# Patient Record
Sex: Female | Born: 1945
Health system: Southern US, Community
[De-identification: ages and names within clinical notes are randomized; demographics above are authoritative.]

## PROBLEM LIST (undated history)

## (undated) DIAGNOSIS — H409 Unspecified glaucoma: Secondary | ICD-10-CM

## (undated) DIAGNOSIS — F419 Anxiety disorder, unspecified: Secondary | ICD-10-CM

## (undated) DIAGNOSIS — I10 Essential (primary) hypertension: Secondary | ICD-10-CM

## (undated) HISTORY — PX: CATARACT EXTRACTION: SUR2

## (undated) HISTORY — DX: Anxiety disorder, unspecified: F41.9

## (undated) HISTORY — PX: KNEE SURGERY: SHX244

## (undated) HISTORY — PX: MOUTH SURGERY: SHX715

## (undated) HISTORY — DX: Essential (primary) hypertension: I10

## (undated) HISTORY — PX: OTHER SURGICAL HISTORY: SHX169

## (undated) HISTORY — DX: Unspecified glaucoma: H40.9

## (undated) HISTORY — PX: FOOT SURGERY: SHX648

---

## 1997-09-27 ENCOUNTER — Ambulatory Visit (HOSPITAL_BASED_OUTPATIENT_CLINIC_OR_DEPARTMENT_OTHER): Admission: RE | Admit: 1997-09-27 | Discharge: 1997-09-27 | Payer: Self-pay | Admitting: Orthopedic Surgery

## 1997-11-09 ENCOUNTER — Ambulatory Visit (HOSPITAL_COMMUNITY): Admission: RE | Admit: 1997-11-09 | Discharge: 1997-11-09 | Payer: Self-pay | Admitting: Obstetrics and Gynecology

## 1998-11-10 ENCOUNTER — Ambulatory Visit (HOSPITAL_COMMUNITY): Admission: RE | Admit: 1998-11-10 | Discharge: 1998-11-10 | Payer: Self-pay | Admitting: Obstetrics and Gynecology

## 1998-11-10 ENCOUNTER — Encounter: Payer: Self-pay | Admitting: Obstetrics and Gynecology

## 1999-05-02 ENCOUNTER — Other Ambulatory Visit: Admission: RE | Admit: 1999-05-02 | Discharge: 1999-05-02 | Payer: Self-pay | Admitting: Obstetrics and Gynecology

## 1999-10-14 ENCOUNTER — Emergency Department (HOSPITAL_COMMUNITY): Admission: EM | Admit: 1999-10-14 | Discharge: 1999-10-14 | Payer: Self-pay | Admitting: *Deleted

## 1999-11-15 ENCOUNTER — Ambulatory Visit (HOSPITAL_COMMUNITY): Admission: RE | Admit: 1999-11-15 | Discharge: 1999-11-15 | Payer: Self-pay | Admitting: Internal Medicine

## 1999-11-15 ENCOUNTER — Encounter: Payer: Self-pay | Admitting: Internal Medicine

## 2000-05-23 ENCOUNTER — Other Ambulatory Visit: Admission: RE | Admit: 2000-05-23 | Discharge: 2000-05-23 | Payer: Self-pay | Admitting: Obstetrics and Gynecology

## 2000-05-28 ENCOUNTER — Encounter: Payer: Self-pay | Admitting: Orthopedic Surgery

## 2000-05-30 ENCOUNTER — Observation Stay (HOSPITAL_COMMUNITY): Admission: RE | Admit: 2000-05-30 | Discharge: 2000-06-01 | Payer: Self-pay | Admitting: Orthopedic Surgery

## 2000-07-30 ENCOUNTER — Ambulatory Visit (HOSPITAL_COMMUNITY)
Admission: RE | Admit: 2000-07-30 | Discharge: 2000-07-30 | Payer: Self-pay | Admitting: Physical Medicine and Rehabilitation

## 2000-11-15 ENCOUNTER — Ambulatory Visit (HOSPITAL_COMMUNITY): Admission: RE | Admit: 2000-11-15 | Discharge: 2000-11-15 | Payer: Self-pay | Admitting: Obstetrics and Gynecology

## 2000-11-15 ENCOUNTER — Encounter: Payer: Self-pay | Admitting: Obstetrics and Gynecology

## 2001-03-19 ENCOUNTER — Encounter: Admission: RE | Admit: 2001-03-19 | Discharge: 2001-03-19 | Payer: Self-pay | Admitting: Obstetrics and Gynecology

## 2001-03-19 ENCOUNTER — Encounter: Payer: Self-pay | Admitting: Obstetrics and Gynecology

## 2001-05-20 ENCOUNTER — Encounter: Admission: RE | Admit: 2001-05-20 | Discharge: 2001-05-20 | Payer: Self-pay | Admitting: Otolaryngology

## 2001-05-20 ENCOUNTER — Encounter: Payer: Self-pay | Admitting: Otolaryngology

## 2001-05-27 ENCOUNTER — Other Ambulatory Visit: Admission: RE | Admit: 2001-05-27 | Discharge: 2001-05-27 | Payer: Self-pay | Admitting: Obstetrics and Gynecology

## 2001-11-18 ENCOUNTER — Ambulatory Visit (HOSPITAL_COMMUNITY): Admission: RE | Admit: 2001-11-18 | Discharge: 2001-11-18 | Payer: Self-pay | Admitting: Obstetrics and Gynecology

## 2001-11-18 ENCOUNTER — Encounter: Payer: Self-pay | Admitting: Obstetrics and Gynecology

## 2001-11-23 ENCOUNTER — Emergency Department (HOSPITAL_COMMUNITY): Admission: EM | Admit: 2001-11-23 | Discharge: 2001-11-24 | Payer: Self-pay | Admitting: Emergency Medicine

## 2002-04-21 ENCOUNTER — Encounter: Payer: Self-pay | Admitting: Family Medicine

## 2002-04-21 ENCOUNTER — Encounter: Admission: RE | Admit: 2002-04-21 | Discharge: 2002-04-21 | Payer: Self-pay | Admitting: Family Medicine

## 2002-12-01 ENCOUNTER — Ambulatory Visit (HOSPITAL_COMMUNITY): Admission: RE | Admit: 2002-12-01 | Discharge: 2002-12-01 | Payer: Self-pay | Admitting: Obstetrics and Gynecology

## 2002-12-01 ENCOUNTER — Encounter: Payer: Self-pay | Admitting: Obstetrics and Gynecology

## 2011-06-20 DIAGNOSIS — M25559 Pain in unspecified hip: Secondary | ICD-10-CM | POA: Diagnosis not present

## 2011-06-20 DIAGNOSIS — Z96659 Presence of unspecified artificial knee joint: Secondary | ICD-10-CM | POA: Diagnosis not present

## 2011-06-22 DIAGNOSIS — H4011X0 Primary open-angle glaucoma, stage unspecified: Secondary | ICD-10-CM | POA: Diagnosis not present

## 2011-08-24 DIAGNOSIS — I10 Essential (primary) hypertension: Secondary | ICD-10-CM | POA: Diagnosis not present

## 2011-08-24 DIAGNOSIS — Z1231 Encounter for screening mammogram for malignant neoplasm of breast: Secondary | ICD-10-CM | POA: Diagnosis not present

## 2011-08-24 DIAGNOSIS — Z1211 Encounter for screening for malignant neoplasm of colon: Secondary | ICD-10-CM | POA: Diagnosis not present

## 2011-08-24 DIAGNOSIS — D649 Anemia, unspecified: Secondary | ICD-10-CM | POA: Diagnosis not present

## 2011-08-24 DIAGNOSIS — R911 Solitary pulmonary nodule: Secondary | ICD-10-CM | POA: Diagnosis not present

## 2011-08-24 DIAGNOSIS — E782 Mixed hyperlipidemia: Secondary | ICD-10-CM | POA: Diagnosis not present

## 2011-08-27 DIAGNOSIS — H4011X0 Primary open-angle glaucoma, stage unspecified: Secondary | ICD-10-CM | POA: Diagnosis not present

## 2011-09-07 DIAGNOSIS — I10 Essential (primary) hypertension: Secondary | ICD-10-CM | POA: Diagnosis not present

## 2011-09-11 DIAGNOSIS — R918 Other nonspecific abnormal finding of lung field: Secondary | ICD-10-CM | POA: Diagnosis not present

## 2011-09-11 DIAGNOSIS — J9819 Other pulmonary collapse: Secondary | ICD-10-CM | POA: Diagnosis not present

## 2011-09-13 DIAGNOSIS — M25579 Pain in unspecified ankle and joints of unspecified foot: Secondary | ICD-10-CM | POA: Diagnosis not present

## 2011-10-22 DIAGNOSIS — S61209A Unspecified open wound of unspecified finger without damage to nail, initial encounter: Secondary | ICD-10-CM | POA: Diagnosis not present

## 2011-10-22 DIAGNOSIS — Z23 Encounter for immunization: Secondary | ICD-10-CM | POA: Diagnosis not present

## 2012-01-01 DIAGNOSIS — H40019 Open angle with borderline findings, low risk, unspecified eye: Secondary | ICD-10-CM | POA: Diagnosis not present

## 2012-01-01 DIAGNOSIS — H524 Presbyopia: Secondary | ICD-10-CM | POA: Diagnosis not present

## 2012-01-02 DIAGNOSIS — Z23 Encounter for immunization: Secondary | ICD-10-CM | POA: Diagnosis not present

## 2012-01-09 DIAGNOSIS — I2510 Atherosclerotic heart disease of native coronary artery without angina pectoris: Secondary | ICD-10-CM | POA: Diagnosis not present

## 2012-01-09 DIAGNOSIS — I10 Essential (primary) hypertension: Secondary | ICD-10-CM | POA: Diagnosis not present

## 2012-01-15 DIAGNOSIS — J209 Acute bronchitis, unspecified: Secondary | ICD-10-CM | POA: Diagnosis not present

## 2012-01-17 DIAGNOSIS — Z1231 Encounter for screening mammogram for malignant neoplasm of breast: Secondary | ICD-10-CM | POA: Diagnosis not present

## 2012-02-21 DIAGNOSIS — M949 Disorder of cartilage, unspecified: Secondary | ICD-10-CM | POA: Diagnosis not present

## 2012-02-21 DIAGNOSIS — M899 Disorder of bone, unspecified: Secondary | ICD-10-CM | POA: Diagnosis not present

## 2012-03-10 DIAGNOSIS — R5383 Other fatigue: Secondary | ICD-10-CM | POA: Diagnosis not present

## 2012-03-10 DIAGNOSIS — I10 Essential (primary) hypertension: Secondary | ICD-10-CM | POA: Diagnosis not present

## 2012-03-10 DIAGNOSIS — R5381 Other malaise: Secondary | ICD-10-CM | POA: Diagnosis not present

## 2012-03-10 DIAGNOSIS — E782 Mixed hyperlipidemia: Secondary | ICD-10-CM | POA: Diagnosis not present

## 2012-03-19 DIAGNOSIS — L723 Sebaceous cyst: Secondary | ICD-10-CM | POA: Diagnosis not present

## 2012-03-19 DIAGNOSIS — L570 Actinic keratosis: Secondary | ICD-10-CM | POA: Diagnosis not present

## 2012-03-19 DIAGNOSIS — L820 Inflamed seborrheic keratosis: Secondary | ICD-10-CM | POA: Diagnosis not present

## 2012-03-19 DIAGNOSIS — B079 Viral wart, unspecified: Secondary | ICD-10-CM | POA: Diagnosis not present

## 2012-03-19 DIAGNOSIS — D1801 Hemangioma of skin and subcutaneous tissue: Secondary | ICD-10-CM | POA: Diagnosis not present

## 2012-03-19 DIAGNOSIS — D485 Neoplasm of uncertain behavior of skin: Secondary | ICD-10-CM | POA: Diagnosis not present

## 2012-03-24 DIAGNOSIS — Z1212 Encounter for screening for malignant neoplasm of rectum: Secondary | ICD-10-CM | POA: Diagnosis not present

## 2012-03-24 DIAGNOSIS — Z124 Encounter for screening for malignant neoplasm of cervix: Secondary | ICD-10-CM | POA: Diagnosis not present

## 2012-04-03 DIAGNOSIS — H9209 Otalgia, unspecified ear: Secondary | ICD-10-CM | POA: Diagnosis not present

## 2012-04-07 DIAGNOSIS — M999 Biomechanical lesion, unspecified: Secondary | ICD-10-CM | POA: Diagnosis not present

## 2012-04-07 DIAGNOSIS — IMO0002 Reserved for concepts with insufficient information to code with codable children: Secondary | ICD-10-CM | POA: Diagnosis not present

## 2012-04-07 DIAGNOSIS — M5137 Other intervertebral disc degeneration, lumbosacral region: Secondary | ICD-10-CM | POA: Diagnosis not present

## 2012-04-10 DIAGNOSIS — M999 Biomechanical lesion, unspecified: Secondary | ICD-10-CM | POA: Diagnosis not present

## 2012-04-10 DIAGNOSIS — IMO0002 Reserved for concepts with insufficient information to code with codable children: Secondary | ICD-10-CM | POA: Diagnosis not present

## 2012-04-10 DIAGNOSIS — M5137 Other intervertebral disc degeneration, lumbosacral region: Secondary | ICD-10-CM | POA: Diagnosis not present

## 2012-04-14 DIAGNOSIS — M999 Biomechanical lesion, unspecified: Secondary | ICD-10-CM | POA: Diagnosis not present

## 2012-04-14 DIAGNOSIS — M5137 Other intervertebral disc degeneration, lumbosacral region: Secondary | ICD-10-CM | POA: Diagnosis not present

## 2012-04-14 DIAGNOSIS — IMO0002 Reserved for concepts with insufficient information to code with codable children: Secondary | ICD-10-CM | POA: Diagnosis not present

## 2012-04-17 DIAGNOSIS — M5137 Other intervertebral disc degeneration, lumbosacral region: Secondary | ICD-10-CM | POA: Diagnosis not present

## 2012-04-17 DIAGNOSIS — M999 Biomechanical lesion, unspecified: Secondary | ICD-10-CM | POA: Diagnosis not present

## 2012-04-17 DIAGNOSIS — IMO0002 Reserved for concepts with insufficient information to code with codable children: Secondary | ICD-10-CM | POA: Diagnosis not present

## 2012-04-21 DIAGNOSIS — M5137 Other intervertebral disc degeneration, lumbosacral region: Secondary | ICD-10-CM | POA: Diagnosis not present

## 2012-04-21 DIAGNOSIS — IMO0002 Reserved for concepts with insufficient information to code with codable children: Secondary | ICD-10-CM | POA: Diagnosis not present

## 2012-04-21 DIAGNOSIS — M999 Biomechanical lesion, unspecified: Secondary | ICD-10-CM | POA: Diagnosis not present

## 2012-04-24 DIAGNOSIS — IMO0002 Reserved for concepts with insufficient information to code with codable children: Secondary | ICD-10-CM | POA: Diagnosis not present

## 2012-04-24 DIAGNOSIS — M999 Biomechanical lesion, unspecified: Secondary | ICD-10-CM | POA: Diagnosis not present

## 2012-04-24 DIAGNOSIS — M5137 Other intervertebral disc degeneration, lumbosacral region: Secondary | ICD-10-CM | POA: Diagnosis not present

## 2012-04-28 DIAGNOSIS — M999 Biomechanical lesion, unspecified: Secondary | ICD-10-CM | POA: Diagnosis not present

## 2012-04-28 DIAGNOSIS — M5137 Other intervertebral disc degeneration, lumbosacral region: Secondary | ICD-10-CM | POA: Diagnosis not present

## 2012-04-28 DIAGNOSIS — IMO0002 Reserved for concepts with insufficient information to code with codable children: Secondary | ICD-10-CM | POA: Diagnosis not present

## 2012-05-01 DIAGNOSIS — M999 Biomechanical lesion, unspecified: Secondary | ICD-10-CM | POA: Diagnosis not present

## 2012-05-01 DIAGNOSIS — IMO0002 Reserved for concepts with insufficient information to code with codable children: Secondary | ICD-10-CM | POA: Diagnosis not present

## 2012-05-01 DIAGNOSIS — M5137 Other intervertebral disc degeneration, lumbosacral region: Secondary | ICD-10-CM | POA: Diagnosis not present

## 2012-05-05 DIAGNOSIS — M999 Biomechanical lesion, unspecified: Secondary | ICD-10-CM | POA: Diagnosis not present

## 2012-05-05 DIAGNOSIS — IMO0002 Reserved for concepts with insufficient information to code with codable children: Secondary | ICD-10-CM | POA: Diagnosis not present

## 2012-05-05 DIAGNOSIS — M5137 Other intervertebral disc degeneration, lumbosacral region: Secondary | ICD-10-CM | POA: Diagnosis not present

## 2012-05-06 DIAGNOSIS — H4011X0 Primary open-angle glaucoma, stage unspecified: Secondary | ICD-10-CM | POA: Diagnosis not present

## 2012-05-09 DIAGNOSIS — M5137 Other intervertebral disc degeneration, lumbosacral region: Secondary | ICD-10-CM | POA: Diagnosis not present

## 2012-05-09 DIAGNOSIS — IMO0002 Reserved for concepts with insufficient information to code with codable children: Secondary | ICD-10-CM | POA: Diagnosis not present

## 2012-05-09 DIAGNOSIS — M999 Biomechanical lesion, unspecified: Secondary | ICD-10-CM | POA: Diagnosis not present

## 2012-05-15 DIAGNOSIS — M999 Biomechanical lesion, unspecified: Secondary | ICD-10-CM | POA: Diagnosis not present

## 2012-05-15 DIAGNOSIS — IMO0002 Reserved for concepts with insufficient information to code with codable children: Secondary | ICD-10-CM | POA: Diagnosis not present

## 2012-05-15 DIAGNOSIS — M5137 Other intervertebral disc degeneration, lumbosacral region: Secondary | ICD-10-CM | POA: Diagnosis not present

## 2012-05-19 DIAGNOSIS — M25819 Other specified joint disorders, unspecified shoulder: Secondary | ICD-10-CM | POA: Diagnosis not present

## 2012-05-29 DIAGNOSIS — M25819 Other specified joint disorders, unspecified shoulder: Secondary | ICD-10-CM | POA: Diagnosis not present

## 2012-06-04 DIAGNOSIS — M25819 Other specified joint disorders, unspecified shoulder: Secondary | ICD-10-CM | POA: Diagnosis not present

## 2012-06-12 DIAGNOSIS — M25819 Other specified joint disorders, unspecified shoulder: Secondary | ICD-10-CM | POA: Diagnosis not present

## 2012-06-18 DIAGNOSIS — M25819 Other specified joint disorders, unspecified shoulder: Secondary | ICD-10-CM | POA: Diagnosis not present

## 2012-06-19 DIAGNOSIS — M25819 Other specified joint disorders, unspecified shoulder: Secondary | ICD-10-CM | POA: Diagnosis not present

## 2012-06-26 DIAGNOSIS — L570 Actinic keratosis: Secondary | ICD-10-CM | POA: Diagnosis not present

## 2012-06-26 DIAGNOSIS — M25819 Other specified joint disorders, unspecified shoulder: Secondary | ICD-10-CM | POA: Diagnosis not present

## 2012-06-26 DIAGNOSIS — B079 Viral wart, unspecified: Secondary | ICD-10-CM | POA: Diagnosis not present

## 2012-06-26 DIAGNOSIS — L723 Sebaceous cyst: Secondary | ICD-10-CM | POA: Diagnosis not present

## 2012-06-26 DIAGNOSIS — L820 Inflamed seborrheic keratosis: Secondary | ICD-10-CM | POA: Diagnosis not present

## 2012-07-03 DIAGNOSIS — M25819 Other specified joint disorders, unspecified shoulder: Secondary | ICD-10-CM | POA: Diagnosis not present

## 2012-07-07 DIAGNOSIS — M25819 Other specified joint disorders, unspecified shoulder: Secondary | ICD-10-CM | POA: Diagnosis not present

## 2012-07-14 DIAGNOSIS — M25819 Other specified joint disorders, unspecified shoulder: Secondary | ICD-10-CM | POA: Diagnosis not present

## 2012-07-17 DIAGNOSIS — L570 Actinic keratosis: Secondary | ICD-10-CM | POA: Diagnosis not present

## 2012-07-17 DIAGNOSIS — M25819 Other specified joint disorders, unspecified shoulder: Secondary | ICD-10-CM | POA: Diagnosis not present

## 2012-08-05 DIAGNOSIS — L821 Other seborrheic keratosis: Secondary | ICD-10-CM | POA: Diagnosis not present

## 2012-08-05 DIAGNOSIS — L819 Disorder of pigmentation, unspecified: Secondary | ICD-10-CM | POA: Diagnosis not present

## 2012-08-05 DIAGNOSIS — L570 Actinic keratosis: Secondary | ICD-10-CM | POA: Diagnosis not present

## 2012-08-18 DIAGNOSIS — M25819 Other specified joint disorders, unspecified shoulder: Secondary | ICD-10-CM | POA: Diagnosis not present

## 2012-09-22 DIAGNOSIS — R918 Other nonspecific abnormal finding of lung field: Secondary | ICD-10-CM | POA: Diagnosis not present

## 2012-09-22 DIAGNOSIS — Z1231 Encounter for screening mammogram for malignant neoplasm of breast: Secondary | ICD-10-CM | POA: Diagnosis not present

## 2012-09-22 DIAGNOSIS — Z1211 Encounter for screening for malignant neoplasm of colon: Secondary | ICD-10-CM | POA: Diagnosis not present

## 2012-09-22 DIAGNOSIS — I10 Essential (primary) hypertension: Secondary | ICD-10-CM | POA: Diagnosis not present

## 2012-09-22 DIAGNOSIS — E782 Mixed hyperlipidemia: Secondary | ICD-10-CM | POA: Diagnosis not present

## 2012-09-25 DIAGNOSIS — M25579 Pain in unspecified ankle and joints of unspecified foot: Secondary | ICD-10-CM | POA: Diagnosis not present

## 2012-10-01 DIAGNOSIS — M19079 Primary osteoarthritis, unspecified ankle and foot: Secondary | ICD-10-CM | POA: Diagnosis not present

## 2012-10-08 DIAGNOSIS — I10 Essential (primary) hypertension: Secondary | ICD-10-CM | POA: Diagnosis not present

## 2012-11-05 DIAGNOSIS — M159 Polyosteoarthritis, unspecified: Secondary | ICD-10-CM | POA: Diagnosis not present

## 2012-11-25 DIAGNOSIS — H4011X0 Primary open-angle glaucoma, stage unspecified: Secondary | ICD-10-CM | POA: Diagnosis not present

## 2012-11-25 DIAGNOSIS — H409 Unspecified glaucoma: Secondary | ICD-10-CM | POA: Diagnosis not present

## 2012-12-09 DIAGNOSIS — J329 Chronic sinusitis, unspecified: Secondary | ICD-10-CM | POA: Diagnosis not present

## 2012-12-17 DIAGNOSIS — M25579 Pain in unspecified ankle and joints of unspecified foot: Secondary | ICD-10-CM | POA: Diagnosis not present

## 2012-12-17 DIAGNOSIS — Q666 Other congenital valgus deformities of feet: Secondary | ICD-10-CM | POA: Diagnosis not present

## 2012-12-19 DIAGNOSIS — L821 Other seborrheic keratosis: Secondary | ICD-10-CM | POA: Diagnosis not present

## 2012-12-19 DIAGNOSIS — D485 Neoplasm of uncertain behavior of skin: Secondary | ICD-10-CM | POA: Diagnosis not present

## 2012-12-19 DIAGNOSIS — L909 Atrophic disorder of skin, unspecified: Secondary | ICD-10-CM | POA: Diagnosis not present

## 2013-01-05 DIAGNOSIS — M25819 Other specified joint disorders, unspecified shoulder: Secondary | ICD-10-CM | POA: Diagnosis not present

## 2013-01-06 DIAGNOSIS — IMO0002 Reserved for concepts with insufficient information to code with codable children: Secondary | ICD-10-CM | POA: Diagnosis not present

## 2013-01-06 DIAGNOSIS — M5137 Other intervertebral disc degeneration, lumbosacral region: Secondary | ICD-10-CM | POA: Diagnosis not present

## 2013-01-06 DIAGNOSIS — M999 Biomechanical lesion, unspecified: Secondary | ICD-10-CM | POA: Diagnosis not present

## 2013-01-07 DIAGNOSIS — Z23 Encounter for immunization: Secondary | ICD-10-CM | POA: Diagnosis not present

## 2013-01-08 DIAGNOSIS — M25819 Other specified joint disorders, unspecified shoulder: Secondary | ICD-10-CM | POA: Diagnosis not present

## 2013-01-12 DIAGNOSIS — I10 Essential (primary) hypertension: Secondary | ICD-10-CM | POA: Diagnosis not present

## 2013-01-13 DIAGNOSIS — M25819 Other specified joint disorders, unspecified shoulder: Secondary | ICD-10-CM | POA: Diagnosis not present

## 2013-01-15 DIAGNOSIS — M25819 Other specified joint disorders, unspecified shoulder: Secondary | ICD-10-CM | POA: Diagnosis not present

## 2013-01-19 DIAGNOSIS — Z1231 Encounter for screening mammogram for malignant neoplasm of breast: Secondary | ICD-10-CM | POA: Diagnosis not present

## 2013-01-21 DIAGNOSIS — M79609 Pain in unspecified limb: Secondary | ICD-10-CM | POA: Diagnosis not present

## 2013-01-27 DIAGNOSIS — M25819 Other specified joint disorders, unspecified shoulder: Secondary | ICD-10-CM | POA: Diagnosis not present

## 2013-01-27 DIAGNOSIS — S46819A Strain of other muscles, fascia and tendons at shoulder and upper arm level, unspecified arm, initial encounter: Secondary | ICD-10-CM | POA: Diagnosis not present

## 2013-02-13 DIAGNOSIS — M25819 Other specified joint disorders, unspecified shoulder: Secondary | ICD-10-CM | POA: Diagnosis not present

## 2013-03-19 DIAGNOSIS — J40 Bronchitis, not specified as acute or chronic: Secondary | ICD-10-CM | POA: Diagnosis not present

## 2013-03-19 DIAGNOSIS — J209 Acute bronchitis, unspecified: Secondary | ICD-10-CM | POA: Diagnosis not present

## 2013-03-23 DIAGNOSIS — M25819 Other specified joint disorders, unspecified shoulder: Secondary | ICD-10-CM | POA: Diagnosis not present

## 2013-04-21 DIAGNOSIS — R059 Cough, unspecified: Secondary | ICD-10-CM | POA: Diagnosis not present

## 2013-04-21 DIAGNOSIS — R05 Cough: Secondary | ICD-10-CM | POA: Diagnosis not present

## 2013-04-21 DIAGNOSIS — D649 Anemia, unspecified: Secondary | ICD-10-CM | POA: Diagnosis not present

## 2013-04-21 DIAGNOSIS — I10 Essential (primary) hypertension: Secondary | ICD-10-CM | POA: Diagnosis not present

## 2013-05-20 DIAGNOSIS — M25819 Other specified joint disorders, unspecified shoulder: Secondary | ICD-10-CM | POA: Diagnosis not present

## 2013-05-21 DIAGNOSIS — J309 Allergic rhinitis, unspecified: Secondary | ICD-10-CM | POA: Diagnosis not present

## 2013-05-21 DIAGNOSIS — K219 Gastro-esophageal reflux disease without esophagitis: Secondary | ICD-10-CM | POA: Diagnosis not present

## 2013-05-21 DIAGNOSIS — R05 Cough: Secondary | ICD-10-CM | POA: Diagnosis not present

## 2013-05-21 DIAGNOSIS — R059 Cough, unspecified: Secondary | ICD-10-CM | POA: Diagnosis not present

## 2013-05-26 DIAGNOSIS — H4011X0 Primary open-angle glaucoma, stage unspecified: Secondary | ICD-10-CM | POA: Diagnosis not present

## 2013-05-26 DIAGNOSIS — H409 Unspecified glaucoma: Secondary | ICD-10-CM | POA: Diagnosis not present

## 2013-07-20 DIAGNOSIS — J310 Chronic rhinitis: Secondary | ICD-10-CM | POA: Diagnosis not present

## 2013-07-20 DIAGNOSIS — R1314 Dysphagia, pharyngoesophageal phase: Secondary | ICD-10-CM | POA: Diagnosis not present

## 2013-07-20 DIAGNOSIS — K2100 Gastro-esophageal reflux disease with esophagitis, without bleeding: Secondary | ICD-10-CM | POA: Diagnosis not present

## 2013-08-10 DIAGNOSIS — K2100 Gastro-esophageal reflux disease with esophagitis, without bleeding: Secondary | ICD-10-CM | POA: Diagnosis not present

## 2013-08-10 DIAGNOSIS — R1314 Dysphagia, pharyngoesophageal phase: Secondary | ICD-10-CM | POA: Diagnosis not present

## 2013-08-10 DIAGNOSIS — J301 Allergic rhinitis due to pollen: Secondary | ICD-10-CM | POA: Diagnosis not present

## 2013-10-07 DIAGNOSIS — J309 Allergic rhinitis, unspecified: Secondary | ICD-10-CM | POA: Diagnosis not present

## 2013-10-07 DIAGNOSIS — Z23 Encounter for immunization: Secondary | ICD-10-CM | POA: Diagnosis not present

## 2013-10-07 DIAGNOSIS — I10 Essential (primary) hypertension: Secondary | ICD-10-CM | POA: Diagnosis not present

## 2013-10-07 DIAGNOSIS — R259 Unspecified abnormal involuntary movements: Secondary | ICD-10-CM | POA: Diagnosis not present

## 2013-10-07 DIAGNOSIS — F430 Acute stress reaction: Secondary | ICD-10-CM | POA: Diagnosis not present

## 2013-10-20 DIAGNOSIS — I10 Essential (primary) hypertension: Secondary | ICD-10-CM | POA: Diagnosis not present

## 2013-10-20 DIAGNOSIS — Z0000 Encounter for general adult medical examination without abnormal findings: Secondary | ICD-10-CM | POA: Diagnosis not present

## 2013-10-20 DIAGNOSIS — Z13820 Encounter for screening for osteoporosis: Secondary | ICD-10-CM | POA: Diagnosis not present

## 2013-10-20 DIAGNOSIS — R259 Unspecified abnormal involuntary movements: Secondary | ICD-10-CM | POA: Diagnosis not present

## 2013-10-20 DIAGNOSIS — Z136 Encounter for screening for cardiovascular disorders: Secondary | ICD-10-CM | POA: Diagnosis not present

## 2013-10-20 DIAGNOSIS — J309 Allergic rhinitis, unspecified: Secondary | ICD-10-CM | POA: Diagnosis not present

## 2013-11-10 DIAGNOSIS — H409 Unspecified glaucoma: Secondary | ICD-10-CM | POA: Diagnosis not present

## 2013-11-10 DIAGNOSIS — H4011X0 Primary open-angle glaucoma, stage unspecified: Secondary | ICD-10-CM | POA: Diagnosis not present

## 2013-11-10 DIAGNOSIS — H2510 Age-related nuclear cataract, unspecified eye: Secondary | ICD-10-CM | POA: Diagnosis not present

## 2013-11-11 DIAGNOSIS — L570 Actinic keratosis: Secondary | ICD-10-CM | POA: Diagnosis not present

## 2013-11-11 DIAGNOSIS — D235 Other benign neoplasm of skin of trunk: Secondary | ICD-10-CM | POA: Diagnosis not present

## 2013-11-11 DIAGNOSIS — L820 Inflamed seborrheic keratosis: Secondary | ICD-10-CM | POA: Diagnosis not present

## 2013-12-11 DIAGNOSIS — J329 Chronic sinusitis, unspecified: Secondary | ICD-10-CM | POA: Diagnosis not present

## 2013-12-21 DIAGNOSIS — J040 Acute laryngitis: Secondary | ICD-10-CM | POA: Diagnosis not present

## 2013-12-21 DIAGNOSIS — R059 Cough, unspecified: Secondary | ICD-10-CM | POA: Diagnosis not present

## 2013-12-21 DIAGNOSIS — K219 Gastro-esophageal reflux disease without esophagitis: Secondary | ICD-10-CM | POA: Diagnosis not present

## 2013-12-21 DIAGNOSIS — E669 Obesity, unspecified: Secondary | ICD-10-CM | POA: Diagnosis not present

## 2013-12-21 DIAGNOSIS — R05 Cough: Secondary | ICD-10-CM | POA: Diagnosis not present

## 2013-12-21 DIAGNOSIS — J329 Chronic sinusitis, unspecified: Secondary | ICD-10-CM | POA: Diagnosis not present

## 2013-12-29 DIAGNOSIS — R490 Dysphonia: Secondary | ICD-10-CM | POA: Diagnosis not present

## 2013-12-29 DIAGNOSIS — R0982 Postnasal drip: Secondary | ICD-10-CM | POA: Diagnosis not present

## 2013-12-29 DIAGNOSIS — K219 Gastro-esophageal reflux disease without esophagitis: Secondary | ICD-10-CM | POA: Diagnosis not present

## 2013-12-29 DIAGNOSIS — Z23 Encounter for immunization: Secondary | ICD-10-CM | POA: Diagnosis not present

## 2013-12-29 DIAGNOSIS — R259 Unspecified abnormal involuntary movements: Secondary | ICD-10-CM | POA: Diagnosis not present

## 2013-12-30 DIAGNOSIS — R259 Unspecified abnormal involuntary movements: Secondary | ICD-10-CM | POA: Diagnosis not present

## 2014-01-08 ENCOUNTER — Encounter: Payer: Self-pay | Admitting: Neurology

## 2014-01-08 ENCOUNTER — Encounter (INDEPENDENT_AMBULATORY_CARE_PROVIDER_SITE_OTHER): Payer: Self-pay

## 2014-01-08 ENCOUNTER — Ambulatory Visit (INDEPENDENT_AMBULATORY_CARE_PROVIDER_SITE_OTHER): Payer: Medicare Other | Admitting: Neurology

## 2014-01-08 ENCOUNTER — Ambulatory Visit: Payer: Medicare Other | Admitting: Neurology

## 2014-01-08 VITALS — BP 119/77 | HR 65 | Temp 97.0°F | Ht 59.0 in | Wt 184.0 lb

## 2014-01-08 DIAGNOSIS — G250 Essential tremor: Secondary | ICD-10-CM

## 2014-01-08 DIAGNOSIS — F411 Generalized anxiety disorder: Secondary | ICD-10-CM | POA: Diagnosis not present

## 2014-01-08 MED ORDER — PRIMIDONE 50 MG PO TABS: ORAL_TABLET | ORAL | Status: DC

## 2014-01-08 NOTE — Progress Notes (Signed)
Subjective:    Patient ID: Tyrianna Lightle is a 68 y.o. female.  HPI    Star Age, MD, PhD [facility] [address], [address] [place], [place] [address]  Dear Dr. Harrington Challenger,   I saw your patient, Maneh Sieben, upon your kind request in my neurologic clinic today for initial consultation of her tremor. The patient is unaccompanied today. As you know, Ms. Brun is a very pleasant 68 year old right-handed woman with an underlying medical history of hypertension, glaucoma, anxiety, and osteoarthritis, status post knee replacement surgeries, who has had a long-standing history of tremors of her hands, also affecting her voice at this time. She had seen a neurologist several years ago. She's currently on propranolol long-acting 60 mg strength one tablet twice daily. In the past she has also tried Mysoline.  She reports a family history of tremors in her father and her brother. She started having tremors in 2006. In 2009, she saw Dr. Kathlen Brunswick, who suggested mysoline. The patient tried it for 1 dose, but had nausea, vomiting and balance problems and stop the medication and did not go back for followup. Aggravating factors include anxiety, stress, sleep deprivation. She says she is very type A. She has noted that occasional alcohol use does reduce the tremor severity. She likes to drink caffeinated sodas, about 3 per day, mostly in the morning .  The tremor has been progressive. It started in her hands and then affected her head and voice. She is no longer able to write cursive. She was hoping, that in the last several years some new medications may have emerged that might help. She is tearful today when talking about her traumatizing childhood. She lives alone and has a lot of drug allergies. She has looked at the DBS option online and does not wish to explore surgery for tremor.   Her Past Medical History Is Significant For: Past Medical History  Diagnosis  Date  . Hypertension   . Anxiety   . Glaucoma     Her Past Surgical History Is Significant For: Past Surgical History  Procedure Laterality Date  . Knee surgery    . Hysterotomy    . Foot surgery      multiple    Her Family History Is Significant For: Family History  Problem Relation Age of Onset  . COPD Father   . COPD Brother     Her Social History Is Significant For: History   Social History  . Marital Status: Divorced    Spouse Name: N/A    Number of Children: 3  . Years of Education: coll.   Occupational History  .      retired   Social History Main Topics  . Smoking status: Never Smoker   . Smokeless tobacco: Never Used  . Alcohol Use: Yes     Comment: occas.  . Drug Use: No  . Sexual Activity: None   Other Topics Concern  . None   Social History Narrative   Patient consumes 2-3 cups of coke daily, is right handed,resides alone.    Her Allergies Are:  Allergies  Allergen Reactions  . Codeine   . Erythromycin   . Hydrocodone-Acetaminophen   . Iodinated Diagnostic Agents   . Meperidine   . Montelukast Sodium   . Morphine Sulfate   :   Her Current Medications Are:  Outpatient Encounter Prescriptions as of 01/08/2014  Medication Sig  . latanoprost (XALATAN) 0.005 % ophthalmic solution 1 drop at  bedtime. 1 drop both eyes daily  . propranolol (INDERAL) 60 MG tablet Take 60 mg by mouth 2 (two) times daily.  Marland Kitchen ALPRAZolam (XANAX) 0.5 MG tablet 2 (two) times daily.  Marland Kitchen amoxicillin-clavulanate (AUGMENTIN) 875-125 MG per tablet As needed , when going to dentist  . ipratropium (ATROVENT) 0.06 % nasal spray 2 drops in each nostril as needed  . losartan-hydrochlorothiazide (HYZAAR) 100-25 MG per tablet 1/2 tablet per day  :   Review of Systems:  Out of a complete 14 point review of systems, all are reviewed and negative with the exception of these symptoms as listed below:   Review of Systems  Neurological: Positive for tremors.       Insomnia     Objective:  Neurologic Exam  Physical Exam Physical Examination:   Filed Vitals:   01/08/14 1103  BP: 119/77  Pulse: 65  Temp: 97 F (36.1 C)    General Examination: The patient is a very pleasant 68 y.o. female in no acute distress. She appears well-developed and well-nourished and well groomed. She appears anxious, nervous, almost a little agitated at times and becomes tearful when she talks about her childhood.  HEENT: Normocephalic, atraumatic, pupils are equal, round and reactive to light and accommodation. Funduscopic exam is normal with sharp disc margins noted. Extraocular tracking is good without limitation to gaze excursion or nystagmus noted. Normal smooth pursuit is noted. Hearing is grossly intact. Face is symmetric with normal facial animation and normal facial sensation. Speech is clear with no dysarthria noted. There is no hypophonia. There is mild head (no-no type) and a moderate voice tremor. Neck is supple with full range of passive and active motion. There are no carotid bruits on auscultation. Oropharynx exam reveals: moderate mouth dryness, adequate dental hygiene and mild airway crowding, due to thicker tongue and thicker soft palate. Mallampati is class I. Tongue protrudes centrally and palate elevates symmetrically.   Chest: Clear to auscultation without wheezing, rhonchi or crackles noted.  Heart: S1+S2+0, regular and normal without murmurs, rubs or gallops noted.   Abdomen: Soft, non-tender and non-distended with normal bowel sounds appreciated on auscultation.  Extremities: There is pitting edema in the L ankle, s/p fracture some 3-4 years ago. Pedal pulses are intact.  Skin: Warm and dry without trophic changes noted. There are no varicose veins.  Musculoskeletal: exam reveals no obvious joint deformities, tenderness or joint swelling or erythema.   Neurologically:  Mental status: The patient is awake, alert and oriented in all 4 spheres. Her  immediate and remote memory, attention, language skills and fund of knowledge are appropriate. There is no evidence of aphasia, agnosia, apraxia or anomia. Speech is clear with normal prosody and enunciation. Thought process is linear. Mood is decreased range and affect is labile.  Cranial nerves II - XII are as described above under HEENT exam. In addition: shoulder shrug is normal with equal shoulder height noted. Motor exam: Normal bulk, strength and tone is noted. There is no drift, rebound. There is no resting tremor. There is a bilateral upper extremity postural and action tremor, which is mild to moderate in degree. There tremor frequency is fairly fast and the amplitude is medium. On Archimedes spiral drawing there is moderate to severe tremulousness noted, left more than right. Handwriting is moderately tremulous, but still legible and fairly large print. There is no evidence of micrographia.  Romberg is negative. Reflexes are 2+ in the UEs and 1+ in the LEs. Babinski: Toes are flexor bilaterally. Fine  motor skills and coordination:  overall mild impairment in the upper body but not impairment in the lower body.   Cerebellar testing: No dysmetria or intention tremor on finger to nose testing. Heel to shin is unremarkable bilaterally, with the exception of limitation in knee mobility bilaterally left worse than right. There is no truncal or gait ataxia.  Sensory exam: intact to light touch, pinprick, vibration, temperature sense in the upper and lower extremities.  Gait, station and balance: She stands with difficulty. No veering to one side is noted. No leaning to one side is noted. Posture is age-appropriate and stance is narrow based. Gait shows normal stride length and normal pace, but she has a limp. No problems turning are noted. She turns in 3 steps. Tandem walk is not possible for her.   Assessment and Plan:   In summary, Jaleiyah Alas is a very pleasant 68 y.o.-year old female with an  underlying medical history of hypertension, glaucoma, anxiety, and osteoarthritis, status post knee replacement surgeries, who has had a long-standing history of tremors of her hands, also affecting her voice at this time. She has a family history of tremors and on examination has overall moderate findings, left a little worse than right. I had a long chat with the patient today about my findings and the diagnosis of ET (essential tremor), the prognosis and treatment options.  she understands that there is limited symptomatic treatment available. Unfortunately she had side effects with Mysoline but may have taken too high a dose and may not have tried it long enough. She is not open to exploring the surgical option with DBS. I offered her a referral for consultation with a neurologist who is trained in DBS but she vehemently declined. She is already on a beta blocker. Her blood pressure is on the lower end and I would not recommend increasing her propranolol. Topamax may cause cognitive side effects and sedation and am reluctant to suggest that. I did ask her to try Mysoline again in lower dose and slower titration. She's agreeable to this. We talked about potential side effects. She is apprehensive but willing to give it another try. Unfortunately there is not a whole lot I can offer this lady. At this juncture we will try Mysoline 50 mg strength quarter tablet each night for a week with weekly increases by a quarter tablet to up to 50 mg each night in about 4 weeks. We talked about  typical tremor triggers. I asked her to return in about 2 weeks for recheck with with our nurse practitioner, Ms. Lam, and I will see the patient back after that. I am not sure if she has had her thyroid function checked recently. I would recommend a thyroid function test, if not checked in the last 6 months.  I provided her with a new prescription for Mysoline and written instructions. She had multiple questions which I  answered.   Thank you very much for allowing me to participate in the care of this nice patient. If I can be of any further assistance to you please do not hesitate to call me at [phone].  Sincerely,   Star Age, MD, PhD

## 2014-01-08 NOTE — Patient Instructions (Addendum)
  Please remember, that any kind of tremor may be exacerbated by anxiety, anger, nervousness, excitement, dehydration, sleep deprivation, by caffeine, and low blood sugar values or blood sugar fluctuations. Some medications, especially some antidepressants and lithium can cause or exacerbate tremors. Tremors may temporarily calm down her subside with the use of a benzodiazepine such as Valium or related medications and with alcohol. Be aware however that drinking alcohol is not an approved treatment or appropriate treatment for tremor control and long-term use of benzodiazepines such as Valium, lorazepam, alprazolam, or clonazepam can cause habit formation, physical and psychological addiction.  We will try mysoline in very low dose with slow titration. 1/4 pill each bedtime x 1 week, then 1/2 pill nightly x 1 weeks, then 3/4 pills nightly x 1 week, then 1 pill nightly thereafter. Common side effects reported are: Sleepiness, drowsiness, balance problems, confusion, and GI related symptoms.  Follow up in 2 months with nurse practioner, Ms. Chauncy Passy. I will see you back afterwards.

## 2014-01-27 ENCOUNTER — Other Ambulatory Visit: Payer: Self-pay

## 2014-01-27 DIAGNOSIS — Z1231 Encounter for screening mammogram for malignant neoplasm of breast: Secondary | ICD-10-CM

## 2014-02-10 ENCOUNTER — Ambulatory Visit
Admission: RE | Admit: 2014-02-10 | Discharge: 2014-02-10 | Disposition: A | Discharge status: Home / Self Care | Payer: Medicare Other | Source: Ambulatory Visit

## 2014-02-10 DIAGNOSIS — Z1231 Encounter for screening mammogram for malignant neoplasm of breast: Secondary | ICD-10-CM | POA: Diagnosis not present

## 2014-02-16 DIAGNOSIS — M8580 Other specified disorders of bone density and structure, unspecified site: Secondary | ICD-10-CM | POA: Diagnosis not present

## 2014-02-17 ENCOUNTER — Telehealth: Payer: Self-pay | Admitting: Neurology

## 2014-02-17 NOTE — Telephone Encounter (Signed)
Left message for patient regarding rescheduling 03/10/14 appointment per Jeani Hawking leaving, rescheduled to Dr. Guadelupe Sabin next available on 03/15/14.

## 2014-03-10 ENCOUNTER — Ambulatory Visit: Payer: Medicare Other | Admitting: Nurse Practitioner

## 2014-03-12 DIAGNOSIS — H4011X1 Primary open-angle glaucoma, mild stage: Secondary | ICD-10-CM | POA: Diagnosis not present

## 2014-03-15 ENCOUNTER — Encounter: Payer: Self-pay | Admitting: Neurology

## 2014-03-15 ENCOUNTER — Ambulatory Visit (INDEPENDENT_AMBULATORY_CARE_PROVIDER_SITE_OTHER): Payer: Medicare Other | Admitting: Neurology

## 2014-03-15 VITALS — BP 139/69 | HR 69 | Temp 97.0°F | Ht 59.0 in | Wt 183.0 lb

## 2014-03-15 DIAGNOSIS — F329 Major depressive disorder, single episode, unspecified: Secondary | ICD-10-CM

## 2014-03-15 DIAGNOSIS — G250 Essential tremor: Secondary | ICD-10-CM

## 2014-03-15 DIAGNOSIS — F32A Depression, unspecified: Secondary | ICD-10-CM

## 2014-03-15 MED ORDER — PRIMIDONE 50 MG PO TABS
ORAL_TABLET | ORAL | Status: DC
Start: 2014-03-15 — End: 2019-06-12

## 2014-03-15 NOTE — Progress Notes (Signed)
Subjective:    Patient ID: Annette Mathews is a 68 y.o. female.  HPI     Interim history:  [name] is a very pleasant 68 year old right-handed woman with an underlying medical history of hypertension, glaucoma, anxiety, and osteoarthritis, status post knee replacement surgeries, who presents for follow-up consultation of her essential tremor. The patient is unaccompanied today. I first met her on 01/08/2014 at the request of her primary care physician, at which time she reported a long-standing history of tremors of her hands, also affecting her voice. Even though she had nausea and vomiting with Mysoline in the past I suggested we try it again and the lower dose with a slower titration. She did not want to pursue surgical consultation for DBS.  Today, she reports that she developed diarrhea, when she went to 1/2 pill from 1/4 pill. She reports hyperactivity and stress. She had tried an antidepressant in the distant past, but did not do well on it, and does not recall its name. She does not think the Mysoline is helpful at half a pill daily. She tried to stop it abruptly but feels she had some problems coming off of it.  She had seen a neurologist several years ago. She's currently on propranolol long-acting 60 mg strength one tablet twice daily. In the past she has also tried Mysoline.   She reports a family history of tremors in her father and her brother. She started having tremors in 2006. In 2009, she saw Dr. Kathlen Brunswick, who suggested mysoline. The patient tried it for 1 dose, but had nausea, vomiting and balance problems and stop the medication and did not go back for followup. Aggravating factors include anxiety, stress, sleep deprivation. She says she is very type A. She has noted that occasional alcohol use does reduce the tremor severity. She likes to drink caffeinated sodas, about 3 per day, mostly in the morning .   The tremor has been progressive. It started in her hands and then  affected her head and voice. She is no longer able to write cursive. She was hoping, that in the last several years some new medications may have emerged that might help. She was tearful when talking about her traumatizing childhood. She lives alone and has a lot of drug allergies. She has looked at the DBS option online and does not wish to explore surgery for tremor.   Her Past Medical History Is Significant For: Past Medical History  Diagnosis Date  . Hypertension   . Anxiety   . Glaucoma     Her Past Surgical History Is Significant For: Past Surgical History  Procedure Laterality Date  . Knee surgery    . Hysterotomy    . Foot surgery      multiple    Her Family History Is Significant For: Family History  Problem Relation Age of Onset  . COPD Father   . COPD Brother     Her Social History Is Significant For: History   Social History  . Marital Status: Divorced    Spouse Name: N/A    Number of Children: 3  . Years of Education: coll.   Occupational History  .      retired   Social History Main Topics  . Smoking status: Never Smoker   . Smokeless tobacco: Never Used  . Alcohol Use: Yes     Comment: occas.  . Drug Use: No  . Sexual Activity: None   Other Topics Concern  . None  Social History Narrative   Patient consumes 2-3 cups of coke daily, is right handed,resides alone.    Her Allergies Are:  Allergies  Allergen Reactions  . Codeine   . Erythromycin   . Hydrocodone-Acetaminophen   . Iodinated Diagnostic Agents   . Meperidine   . Montelukast Sodium   . Morphine Sulfate   :   Her Current Medications Are:  Outpatient Encounter Prescriptions as of 03/15/2014  Medication Sig  . ALPRAZolam (XANAX) 0.5 MG tablet 2 (two) times daily.  . AMOXICILLIN PO Take 2,000 mg by mouth. Every 6 months when going to dentist  . FLUVIRIN SUSP   . ipratropium (ATROVENT) 0.06 % nasal spray 2 drops in each nostril as needed  . latanoprost (XALATAN) 0.005 %  ophthalmic solution 1 drop at bedtime. 1 drop both eyes daily  . losartan-hydrochlorothiazide (HYZAAR) 100-25 MG per tablet 1/2 tablet per day  . primidone (MYSOLINE) 50 MG tablet Take 1/4 pill each night for 1 week, then 1/2 pill each night for 1 week, then 3/4 pills each night for 1 week, then 1 pill each night. (Patient taking differently: 50 mg. Taking 1/2 tablet once daily)  . propranolol (INDERAL) 60 MG tablet Take 60 mg by mouth 2 (two) times daily.  . [DISCONTINUED] amoxicillin-clavulanate (AUGMENTIN) 875-125 MG per tablet As needed , when going to dentist  :  Review of Systems:  Out of a complete 14 point review of systems, all are reviewed and negative with the exception of these symptoms as listed below:   Review of Systems  Respiratory: Positive for cough.   Neurological: Positive for tremors.    Objective:  Neurologic Exam  Physical Exam Physical Examination:   Filed Vitals:   03/15/14 1111  BP: 139/69  Pulse: 69  Temp: 97 F (36.1 C)    General Examination: The patient is a very pleasant 68 y.o. female in no acute distress. She appears well-developed and well-nourished and well groomed. She appears anxious and is tearful at times.   HEENT: Normocephalic, atraumatic, pupils are equal, round and reactive to light and accommodation. Funduscopic exam is normal with sharp disc margins noted. She has mild bilateral cataracts. Extraocular tracking is good without limitation to gaze excursion or nystagmus noted. Normal smooth pursuit is noted. Hearing is grossly intact. Face is symmetric with normal facial animation and normal facial sensation. Speech is clear with no dysarthria noted. There is no hypophonia. There is mild head (no-no type) and a moderate voice tremor. Neck is supple with full range of passive and active motion. There are no carotid bruits on auscultation. Oropharynx exam reveals: moderate mouth dryness, adequate dental hygiene and mild airway crowding, due to  thicker tongue and thicker soft palate. Mallampati is class I. Tongue protrudes centrally and palate elevates symmetrically.   Chest: Clear to auscultation without wheezing, rhonchi or crackles noted.  Heart: S1+S2+0, regular and normal without murmurs, rubs or gallops noted.   Abdomen: Soft, non-tender and non-distended with normal bowel sounds appreciated on auscultation.  Extremities: There is 1+ pitting edema in the L ankle, s/p fracture some 3-4 years ago. Pedal pulses are intact.  Skin: Warm and dry without trophic changes noted. There are no varicose veins.  Musculoskeletal: exam reveals no obvious joint deformities, tenderness or joint swelling or erythema.   Neurologically:  Mental status: The patient is awake, alert and oriented in all 4 spheres. Her immediate and remote memory, attention, language skills and fund of knowledge are appropriate. There is no evidence  of aphasia, agnosia, apraxia or anomia. Speech is clear with normal prosody and enunciation. Thought process is linear. Mood is decreased range and affect is labile.  Cranial nerves II - XII are as described above under HEENT exam. In addition: shoulder shrug is normal with equal shoulder height noted. Motor exam: Normal bulk, strength and tone is noted. There is no drift, or rebound. There is no resting tremor. There is a bilateral upper extremity postural and action tremor, which is very mild today. There tremor frequency is fairly fast and the amplitude is medium.   Romberg is negative. Reflexes are 2+ in the UEs and 1+ in the LEs. Babinski: Toes are flexor bilaterally. Fine motor skills and coordination:  overall mild impairment in the upper body but not impairment in the lower body.   Cerebellar testing: No dysmetria or intention tremor on finger to nose testing. Heel to shin is unremarkable bilaterally, with the exception of limitation in knee mobility bilaterally left worse than right. There is no truncal or gait ataxia.   Sensory exam: intact to light touch in the upper and lower extremities.  Gait, station and balance: She stands with difficulty. No veering to one side is noted. No leaning to one side is noted. Posture is age-appropriate and stance is narrow based. Gait shows normal stride length and normal pace, but she has a limp. No problems turning are noted. She turns in 3 steps. Tandem walk is not possible for her.   Assessment and Plan:   In summary, Chester Romero is a very pleasant 68 year old female with an underlying medical history of hypertension, glaucoma, anxiety, and osteoarthritis, status post knee replacement surgeries, who has had a long-standing history of  essential tremor, affecting primarily both hands and particularly her handwriting. She was not able to tolerate Mysoline and at this time I suggested she taper off of it. She developed diarrhea on it. She says she has a sensitive digestive system. She is advised to go down to quarter pill daily for 1 week then quarter pill every other day for one week then stop. I will prescribe a 2 week supply for her. She needs a refill to be able to come off of it gradually. I provided her with that. We talked at length about essential tremor and treatment options. She is on a beta blocker at this time and is encouraged to continue with that. She is encouraged to follow-up with her primary care physician at this time and I will see her back as needed. She did not wish to pursue DBS evaluation for her tremors. For her depression and anxiety she is advised to follow-up with her primary care physician. In the past she tried an antidepressant and had side effects but there are some newer medications out that she may benefit from. She is encouraged to talk to Dr. Harrington Challenger about potentially trying another antidepressant. She is worried about the financial implications of trying a new medication. She does endorse stress. She has an appointment with Dr. Harrington Challenger next month. I gave  her written instructions and provided her with a prescription so she can taper off of Mysoline. I answered all her questions today and the patient was in agreement.

## 2014-03-15 NOTE — Patient Instructions (Signed)
  Please remember, that any kind of tremor may be exacerbated by anxiety, anger, nervousness, excitement, dehydration, sleep deprivation, by caffeine, and low blood sugar values or blood sugar fluctuations. Some medications, especially some antidepressants and lithium can cause or exacerbate tremors. Tremors may temporarily calm down her subside with the use of a benzodiazepine such as Valium or related medications and with alcohol. Be aware however that drinking alcohol is not an approved treatment or appropriate treatment for tremor control and long-term use of benzodiazepines such as Valium, lorazepam, alprazolam, or clonazepam can cause habit formation, physical and psychological addiction.  We will gradually take you off the primidone: take 1/4 pill each night for 1 week, then 1/4 pill every other night for one week, then stop.   I will see you back as needed. Please follow up with Dr. Harrington Challenger as scheduled. Please talk to him about potentially trying a newer kind of antidepressant.

## 2014-03-19 DIAGNOSIS — J301 Allergic rhinitis due to pollen: Secondary | ICD-10-CM | POA: Diagnosis not present

## 2014-03-19 DIAGNOSIS — H60502 Unspecified acute noninfective otitis externa, left ear: Secondary | ICD-10-CM | POA: Diagnosis not present

## 2014-03-19 DIAGNOSIS — K219 Gastro-esophageal reflux disease without esophagitis: Secondary | ICD-10-CM | POA: Diagnosis not present

## 2014-03-19 DIAGNOSIS — R0982 Postnasal drip: Secondary | ICD-10-CM | POA: Diagnosis not present

## 2014-04-06 DIAGNOSIS — L299 Pruritus, unspecified: Secondary | ICD-10-CM | POA: Diagnosis not present

## 2014-04-06 DIAGNOSIS — H60502 Unspecified acute noninfective otitis externa, left ear: Secondary | ICD-10-CM | POA: Diagnosis not present

## 2014-05-03 ENCOUNTER — Other Ambulatory Visit: Payer: Self-pay | Admitting: Family Medicine

## 2014-05-03 DIAGNOSIS — R110 Nausea: Secondary | ICD-10-CM

## 2014-05-03 DIAGNOSIS — R0981 Nasal congestion: Secondary | ICD-10-CM | POA: Diagnosis not present

## 2014-05-03 DIAGNOSIS — I10 Essential (primary) hypertension: Secondary | ICD-10-CM | POA: Diagnosis not present

## 2014-05-03 DIAGNOSIS — R350 Frequency of micturition: Secondary | ICD-10-CM | POA: Diagnosis not present

## 2014-05-03 DIAGNOSIS — M8580 Other specified disorders of bone density and structure, unspecified site: Secondary | ICD-10-CM | POA: Diagnosis not present

## 2014-05-03 DIAGNOSIS — R197 Diarrhea, unspecified: Secondary | ICD-10-CM | POA: Diagnosis not present

## 2014-05-10 ENCOUNTER — Ambulatory Visit
Admission: RE | Admit: 2014-05-10 | Discharge: 2014-05-10 | Disposition: A | Discharge status: Home / Self Care | Payer: Medicare Other | Source: Ambulatory Visit | Attending: Family Medicine | Admitting: Family Medicine

## 2014-05-10 DIAGNOSIS — R110 Nausea: Secondary | ICD-10-CM | POA: Diagnosis not present

## 2014-05-10 DIAGNOSIS — R1011 Right upper quadrant pain: Secondary | ICD-10-CM | POA: Diagnosis not present

## 2014-05-13 ENCOUNTER — Other Ambulatory Visit (HOSPITAL_COMMUNITY): Payer: Self-pay | Admitting: Family Medicine

## 2014-05-13 DIAGNOSIS — R1011 Right upper quadrant pain: Secondary | ICD-10-CM

## 2014-05-31 ENCOUNTER — Ambulatory Visit (HOSPITAL_COMMUNITY): Payer: Medicare Other

## 2014-05-31 DIAGNOSIS — J209 Acute bronchitis, unspecified: Secondary | ICD-10-CM | POA: Diagnosis not present

## 2014-05-31 DIAGNOSIS — J329 Chronic sinusitis, unspecified: Secondary | ICD-10-CM | POA: Diagnosis not present

## 2014-06-03 DIAGNOSIS — R05 Cough: Secondary | ICD-10-CM | POA: Diagnosis not present

## 2014-06-03 DIAGNOSIS — R899 Unspecified abnormal finding in specimens from other organs, systems and tissues: Secondary | ICD-10-CM | POA: Diagnosis not present

## 2014-06-10 ENCOUNTER — Ambulatory Visit: Payer: Medicare Other | Admitting: Neurology

## 2014-06-15 DIAGNOSIS — J4540 Moderate persistent asthma, uncomplicated: Secondary | ICD-10-CM | POA: Diagnosis not present

## 2014-06-15 DIAGNOSIS — J309 Allergic rhinitis, unspecified: Secondary | ICD-10-CM | POA: Diagnosis not present

## 2014-06-15 DIAGNOSIS — J370 Chronic laryngitis: Secondary | ICD-10-CM | POA: Diagnosis not present

## 2014-06-21 DIAGNOSIS — J019 Acute sinusitis, unspecified: Secondary | ICD-10-CM | POA: Diagnosis not present

## 2014-06-21 DIAGNOSIS — J301 Allergic rhinitis due to pollen: Secondary | ICD-10-CM | POA: Diagnosis not present

## 2014-09-20 DIAGNOSIS — H4011X2 Primary open-angle glaucoma, moderate stage: Secondary | ICD-10-CM | POA: Diagnosis not present

## 2014-10-20 DIAGNOSIS — T63443A Toxic effect of venom of bees, assault, initial encounter: Secondary | ICD-10-CM | POA: Diagnosis not present

## 2014-10-20 DIAGNOSIS — J3089 Other allergic rhinitis: Secondary | ICD-10-CM | POA: Diagnosis not present

## 2014-10-20 DIAGNOSIS — J301 Allergic rhinitis due to pollen: Secondary | ICD-10-CM | POA: Diagnosis not present

## 2014-10-22 DIAGNOSIS — Z136 Encounter for screening for cardiovascular disorders: Secondary | ICD-10-CM | POA: Diagnosis not present

## 2014-10-22 DIAGNOSIS — M810 Age-related osteoporosis without current pathological fracture: Secondary | ICD-10-CM | POA: Diagnosis not present

## 2014-10-22 DIAGNOSIS — Z0000 Encounter for general adult medical examination without abnormal findings: Secondary | ICD-10-CM | POA: Diagnosis not present

## 2014-10-22 DIAGNOSIS — I10 Essential (primary) hypertension: Secondary | ICD-10-CM | POA: Diagnosis not present

## 2014-10-22 DIAGNOSIS — H409 Unspecified glaucoma: Secondary | ICD-10-CM | POA: Diagnosis not present

## 2014-10-22 DIAGNOSIS — Z1211 Encounter for screening for malignant neoplasm of colon: Secondary | ICD-10-CM | POA: Diagnosis not present

## 2014-10-22 DIAGNOSIS — G250 Essential tremor: Secondary | ICD-10-CM | POA: Diagnosis not present

## 2014-11-25 DIAGNOSIS — M722 Plantar fascial fibromatosis: Secondary | ICD-10-CM | POA: Diagnosis not present

## 2014-12-03 DIAGNOSIS — Z23 Encounter for immunization: Secondary | ICD-10-CM | POA: Diagnosis not present

## 2014-12-08 DIAGNOSIS — X32XXXD Exposure to sunlight, subsequent encounter: Secondary | ICD-10-CM | POA: Diagnosis not present

## 2014-12-08 DIAGNOSIS — L570 Actinic keratosis: Secondary | ICD-10-CM | POA: Diagnosis not present

## 2014-12-08 DIAGNOSIS — Z1283 Encounter for screening for malignant neoplasm of skin: Secondary | ICD-10-CM | POA: Diagnosis not present

## 2014-12-08 DIAGNOSIS — L918 Other hypertrophic disorders of the skin: Secondary | ICD-10-CM | POA: Diagnosis not present

## 2014-12-20 ENCOUNTER — Other Ambulatory Visit: Payer: Self-pay

## 2014-12-20 DIAGNOSIS — Z1231 Encounter for screening mammogram for malignant neoplasm of breast: Secondary | ICD-10-CM

## 2014-12-22 DIAGNOSIS — L918 Other hypertrophic disorders of the skin: Secondary | ICD-10-CM | POA: Diagnosis not present

## 2014-12-29 DIAGNOSIS — G479 Sleep disorder, unspecified: Secondary | ICD-10-CM | POA: Diagnosis not present

## 2014-12-29 DIAGNOSIS — R5383 Other fatigue: Secondary | ICD-10-CM | POA: Diagnosis not present

## 2014-12-29 DIAGNOSIS — G250 Essential tremor: Secondary | ICD-10-CM | POA: Diagnosis not present

## 2014-12-29 DIAGNOSIS — I10 Essential (primary) hypertension: Secondary | ICD-10-CM | POA: Diagnosis not present

## 2014-12-29 DIAGNOSIS — F411 Generalized anxiety disorder: Secondary | ICD-10-CM | POA: Diagnosis not present

## 2015-01-24 DIAGNOSIS — K629 Disease of anus and rectum, unspecified: Secondary | ICD-10-CM | POA: Diagnosis not present

## 2015-01-24 DIAGNOSIS — Z1211 Encounter for screening for malignant neoplasm of colon: Secondary | ICD-10-CM | POA: Diagnosis not present

## 2015-01-31 DIAGNOSIS — M19172 Post-traumatic osteoarthritis, left ankle and foot: Secondary | ICD-10-CM | POA: Diagnosis not present

## 2015-01-31 DIAGNOSIS — M2140 Flat foot [pes planus] (acquired), unspecified foot: Secondary | ICD-10-CM | POA: Diagnosis not present

## 2015-02-11 DIAGNOSIS — R197 Diarrhea, unspecified: Secondary | ICD-10-CM | POA: Diagnosis not present

## 2015-02-11 DIAGNOSIS — Z1211 Encounter for screening for malignant neoplasm of colon: Secondary | ICD-10-CM | POA: Diagnosis not present

## 2015-02-14 ENCOUNTER — Ambulatory Visit
Admission: RE | Admit: 2015-02-14 | Discharge: 2015-02-14 | Disposition: A | Discharge status: Home / Self Care | Payer: Medicare Other | Source: Ambulatory Visit

## 2015-02-14 DIAGNOSIS — Z1231 Encounter for screening mammogram for malignant neoplasm of breast: Secondary | ICD-10-CM

## 2015-03-02 DIAGNOSIS — M1990 Unspecified osteoarthritis, unspecified site: Secondary | ICD-10-CM | POA: Diagnosis not present

## 2015-03-02 DIAGNOSIS — F411 Generalized anxiety disorder: Secondary | ICD-10-CM | POA: Diagnosis not present

## 2015-03-02 DIAGNOSIS — R251 Tremor, unspecified: Secondary | ICD-10-CM | POA: Diagnosis not present

## 2015-03-22 DIAGNOSIS — H401111 Primary open-angle glaucoma, right eye, mild stage: Secondary | ICD-10-CM | POA: Diagnosis not present

## 2015-03-22 DIAGNOSIS — H401122 Primary open-angle glaucoma, left eye, moderate stage: Secondary | ICD-10-CM | POA: Diagnosis not present

## 2015-04-01 DIAGNOSIS — D175 Benign lipomatous neoplasm of intra-abdominal organs: Secondary | ICD-10-CM | POA: Diagnosis not present

## 2015-04-01 DIAGNOSIS — D126 Benign neoplasm of colon, unspecified: Secondary | ICD-10-CM | POA: Diagnosis not present

## 2015-04-01 DIAGNOSIS — K640 First degree hemorrhoids: Secondary | ICD-10-CM | POA: Diagnosis not present

## 2015-04-01 DIAGNOSIS — Z1211 Encounter for screening for malignant neoplasm of colon: Secondary | ICD-10-CM | POA: Diagnosis not present

## 2015-04-01 DIAGNOSIS — K5730 Diverticulosis of large intestine without perforation or abscess without bleeding: Secondary | ICD-10-CM | POA: Diagnosis not present

## 2015-04-01 DIAGNOSIS — D120 Benign neoplasm of cecum: Secondary | ICD-10-CM | POA: Diagnosis not present

## 2015-05-03 DIAGNOSIS — H401132 Primary open-angle glaucoma, bilateral, moderate stage: Secondary | ICD-10-CM | POA: Diagnosis not present

## 2015-05-12 DIAGNOSIS — J0141 Acute recurrent pansinusitis: Secondary | ICD-10-CM | POA: Diagnosis not present

## 2015-05-18 DIAGNOSIS — L600 Ingrowing nail: Secondary | ICD-10-CM | POA: Diagnosis not present

## 2015-06-01 DIAGNOSIS — L600 Ingrowing nail: Secondary | ICD-10-CM | POA: Diagnosis not present

## 2015-07-01 DIAGNOSIS — H401122 Primary open-angle glaucoma, left eye, moderate stage: Secondary | ICD-10-CM | POA: Diagnosis not present

## 2015-07-01 DIAGNOSIS — H401112 Primary open-angle glaucoma, right eye, moderate stage: Secondary | ICD-10-CM | POA: Diagnosis not present

## 2015-07-13 DIAGNOSIS — M79676 Pain in unspecified toe(s): Secondary | ICD-10-CM | POA: Diagnosis not present

## 2015-07-20 DIAGNOSIS — J3089 Other allergic rhinitis: Secondary | ICD-10-CM | POA: Diagnosis not present

## 2015-07-20 DIAGNOSIS — T63443A Toxic effect of venom of bees, assault, initial encounter: Secondary | ICD-10-CM | POA: Diagnosis not present

## 2015-07-20 DIAGNOSIS — J301 Allergic rhinitis due to pollen: Secondary | ICD-10-CM | POA: Diagnosis not present

## 2015-09-09 DIAGNOSIS — R42 Dizziness and giddiness: Secondary | ICD-10-CM | POA: Diagnosis not present

## 2015-09-09 DIAGNOSIS — J309 Allergic rhinitis, unspecified: Secondary | ICD-10-CM | POA: Diagnosis not present

## 2015-09-27 DIAGNOSIS — H401132 Primary open-angle glaucoma, bilateral, moderate stage: Secondary | ICD-10-CM | POA: Diagnosis not present

## 2015-10-25 DIAGNOSIS — Z0000 Encounter for general adult medical examination without abnormal findings: Secondary | ICD-10-CM | POA: Diagnosis not present

## 2015-10-25 DIAGNOSIS — I10 Essential (primary) hypertension: Secondary | ICD-10-CM | POA: Diagnosis not present

## 2015-10-25 DIAGNOSIS — F411 Generalized anxiety disorder: Secondary | ICD-10-CM | POA: Diagnosis not present

## 2015-10-25 DIAGNOSIS — G250 Essential tremor: Secondary | ICD-10-CM | POA: Diagnosis not present

## 2015-10-25 DIAGNOSIS — E559 Vitamin D deficiency, unspecified: Secondary | ICD-10-CM | POA: Diagnosis not present

## 2015-10-25 DIAGNOSIS — M810 Age-related osteoporosis without current pathological fracture: Secondary | ICD-10-CM | POA: Diagnosis not present

## 2015-11-08 DIAGNOSIS — J0100 Acute maxillary sinusitis, unspecified: Secondary | ICD-10-CM | POA: Diagnosis not present

## 2015-12-26 DIAGNOSIS — Z23 Encounter for immunization: Secondary | ICD-10-CM | POA: Diagnosis not present

## 2015-12-26 DIAGNOSIS — L304 Erythema intertrigo: Secondary | ICD-10-CM | POA: Diagnosis not present

## 2015-12-26 DIAGNOSIS — Z1283 Encounter for screening for malignant neoplasm of skin: Secondary | ICD-10-CM | POA: Diagnosis not present

## 2015-12-30 DIAGNOSIS — I10 Essential (primary) hypertension: Secondary | ICD-10-CM | POA: Diagnosis not present

## 2015-12-30 DIAGNOSIS — K1379 Other lesions of oral mucosa: Secondary | ICD-10-CM | POA: Diagnosis not present

## 2016-02-03 ENCOUNTER — Other Ambulatory Visit: Payer: Self-pay | Admitting: Family Medicine

## 2016-02-03 DIAGNOSIS — Z1231 Encounter for screening mammogram for malignant neoplasm of breast: Secondary | ICD-10-CM

## 2016-02-07 DIAGNOSIS — J302 Other seasonal allergic rhinitis: Secondary | ICD-10-CM | POA: Diagnosis not present

## 2016-02-14 DIAGNOSIS — R05 Cough: Secondary | ICD-10-CM | POA: Diagnosis not present

## 2016-02-20 DIAGNOSIS — M6740 Ganglion, unspecified site: Secondary | ICD-10-CM | POA: Diagnosis not present

## 2016-02-20 DIAGNOSIS — J329 Chronic sinusitis, unspecified: Secondary | ICD-10-CM | POA: Diagnosis not present

## 2016-02-23 DIAGNOSIS — J329 Chronic sinusitis, unspecified: Secondary | ICD-10-CM | POA: Diagnosis not present

## 2016-02-27 ENCOUNTER — Ambulatory Visit
Admission: RE | Admit: 2016-02-27 | Discharge: 2016-02-27 | Disposition: A | Discharge status: Home / Self Care | Payer: Medicare Other | Source: Ambulatory Visit | Attending: Family Medicine | Admitting: Family Medicine

## 2016-02-27 ENCOUNTER — Ambulatory Visit: Payer: Medicare Other

## 2016-02-27 DIAGNOSIS — Z1231 Encounter for screening mammogram for malignant neoplasm of breast: Secondary | ICD-10-CM

## 2016-02-27 DIAGNOSIS — J320 Chronic maxillary sinusitis: Secondary | ICD-10-CM | POA: Diagnosis not present

## 2016-03-09 DIAGNOSIS — H401122 Primary open-angle glaucoma, left eye, moderate stage: Secondary | ICD-10-CM | POA: Diagnosis not present

## 2016-03-09 DIAGNOSIS — H2513 Age-related nuclear cataract, bilateral: Secondary | ICD-10-CM | POA: Diagnosis not present

## 2016-03-09 DIAGNOSIS — H401111 Primary open-angle glaucoma, right eye, mild stage: Secondary | ICD-10-CM | POA: Diagnosis not present

## 2016-03-26 DIAGNOSIS — M810 Age-related osteoporosis without current pathological fracture: Secondary | ICD-10-CM | POA: Diagnosis not present

## 2016-03-26 DIAGNOSIS — M8588 Other specified disorders of bone density and structure, other site: Secondary | ICD-10-CM | POA: Diagnosis not present

## 2016-04-20 DIAGNOSIS — M6740 Ganglion, unspecified site: Secondary | ICD-10-CM | POA: Diagnosis not present

## 2016-04-20 DIAGNOSIS — M19032 Primary osteoarthritis, left wrist: Secondary | ICD-10-CM | POA: Diagnosis not present

## 2016-04-20 DIAGNOSIS — M25532 Pain in left wrist: Secondary | ICD-10-CM | POA: Diagnosis not present

## 2016-07-18 DIAGNOSIS — T63443A Toxic effect of venom of bees, assault, initial encounter: Secondary | ICD-10-CM | POA: Diagnosis not present

## 2016-07-18 DIAGNOSIS — J3089 Other allergic rhinitis: Secondary | ICD-10-CM | POA: Diagnosis not present

## 2016-07-18 DIAGNOSIS — B999 Unspecified infectious disease: Secondary | ICD-10-CM | POA: Diagnosis not present

## 2016-07-18 DIAGNOSIS — J301 Allergic rhinitis due to pollen: Secondary | ICD-10-CM | POA: Diagnosis not present

## 2016-07-31 DIAGNOSIS — J301 Allergic rhinitis due to pollen: Secondary | ICD-10-CM | POA: Diagnosis not present

## 2016-07-31 DIAGNOSIS — J3081 Allergic rhinitis due to animal (cat) (dog) hair and dander: Secondary | ICD-10-CM | POA: Diagnosis not present

## 2016-07-31 DIAGNOSIS — J3089 Other allergic rhinitis: Secondary | ICD-10-CM | POA: Diagnosis not present

## 2016-07-31 DIAGNOSIS — T63443A Toxic effect of venom of bees, assault, initial encounter: Secondary | ICD-10-CM | POA: Diagnosis not present

## 2016-08-02 DIAGNOSIS — J3081 Allergic rhinitis due to animal (cat) (dog) hair and dander: Secondary | ICD-10-CM | POA: Diagnosis not present

## 2016-08-02 DIAGNOSIS — J301 Allergic rhinitis due to pollen: Secondary | ICD-10-CM | POA: Diagnosis not present

## 2016-08-02 DIAGNOSIS — J3089 Other allergic rhinitis: Secondary | ICD-10-CM | POA: Diagnosis not present

## 2016-08-08 DIAGNOSIS — J3081 Allergic rhinitis due to animal (cat) (dog) hair and dander: Secondary | ICD-10-CM | POA: Diagnosis not present

## 2016-08-08 DIAGNOSIS — J3089 Other allergic rhinitis: Secondary | ICD-10-CM | POA: Diagnosis not present

## 2016-08-08 DIAGNOSIS — J301 Allergic rhinitis due to pollen: Secondary | ICD-10-CM | POA: Diagnosis not present

## 2016-08-15 DIAGNOSIS — J301 Allergic rhinitis due to pollen: Secondary | ICD-10-CM | POA: Diagnosis not present

## 2016-08-15 DIAGNOSIS — J3081 Allergic rhinitis due to animal (cat) (dog) hair and dander: Secondary | ICD-10-CM | POA: Diagnosis not present

## 2016-08-15 DIAGNOSIS — J3089 Other allergic rhinitis: Secondary | ICD-10-CM | POA: Diagnosis not present

## 2016-08-22 DIAGNOSIS — J301 Allergic rhinitis due to pollen: Secondary | ICD-10-CM | POA: Diagnosis not present

## 2016-08-22 DIAGNOSIS — J3081 Allergic rhinitis due to animal (cat) (dog) hair and dander: Secondary | ICD-10-CM | POA: Diagnosis not present

## 2016-08-22 DIAGNOSIS — J3089 Other allergic rhinitis: Secondary | ICD-10-CM | POA: Diagnosis not present

## 2016-08-29 DIAGNOSIS — J3081 Allergic rhinitis due to animal (cat) (dog) hair and dander: Secondary | ICD-10-CM | POA: Diagnosis not present

## 2016-08-29 DIAGNOSIS — J3089 Other allergic rhinitis: Secondary | ICD-10-CM | POA: Diagnosis not present

## 2016-08-29 DIAGNOSIS — J301 Allergic rhinitis due to pollen: Secondary | ICD-10-CM | POA: Diagnosis not present

## 2016-09-05 DIAGNOSIS — J3081 Allergic rhinitis due to animal (cat) (dog) hair and dander: Secondary | ICD-10-CM | POA: Diagnosis not present

## 2016-09-05 DIAGNOSIS — J3089 Other allergic rhinitis: Secondary | ICD-10-CM | POA: Diagnosis not present

## 2016-09-05 DIAGNOSIS — J301 Allergic rhinitis due to pollen: Secondary | ICD-10-CM | POA: Diagnosis not present

## 2016-09-10 DIAGNOSIS — H401131 Primary open-angle glaucoma, bilateral, mild stage: Secondary | ICD-10-CM | POA: Diagnosis not present

## 2016-09-12 DIAGNOSIS — J301 Allergic rhinitis due to pollen: Secondary | ICD-10-CM | POA: Diagnosis not present

## 2016-09-12 DIAGNOSIS — J3081 Allergic rhinitis due to animal (cat) (dog) hair and dander: Secondary | ICD-10-CM | POA: Diagnosis not present

## 2016-09-12 DIAGNOSIS — J3089 Other allergic rhinitis: Secondary | ICD-10-CM | POA: Diagnosis not present

## 2016-09-18 DIAGNOSIS — S060X0A Concussion without loss of consciousness, initial encounter: Secondary | ICD-10-CM | POA: Diagnosis not present

## 2016-09-19 DIAGNOSIS — J301 Allergic rhinitis due to pollen: Secondary | ICD-10-CM | POA: Diagnosis not present

## 2016-09-19 DIAGNOSIS — J3089 Other allergic rhinitis: Secondary | ICD-10-CM | POA: Diagnosis not present

## 2016-09-26 DIAGNOSIS — J3089 Other allergic rhinitis: Secondary | ICD-10-CM | POA: Diagnosis not present

## 2016-09-26 DIAGNOSIS — J301 Allergic rhinitis due to pollen: Secondary | ICD-10-CM | POA: Diagnosis not present

## 2016-09-26 DIAGNOSIS — J3081 Allergic rhinitis due to animal (cat) (dog) hair and dander: Secondary | ICD-10-CM | POA: Diagnosis not present

## 2016-10-03 DIAGNOSIS — J301 Allergic rhinitis due to pollen: Secondary | ICD-10-CM | POA: Diagnosis not present

## 2016-10-03 DIAGNOSIS — J3081 Allergic rhinitis due to animal (cat) (dog) hair and dander: Secondary | ICD-10-CM | POA: Diagnosis not present

## 2016-10-03 DIAGNOSIS — J3089 Other allergic rhinitis: Secondary | ICD-10-CM | POA: Diagnosis not present

## 2016-10-09 DIAGNOSIS — I10 Essential (primary) hypertension: Secondary | ICD-10-CM | POA: Diagnosis not present

## 2016-10-09 DIAGNOSIS — J3089 Other allergic rhinitis: Secondary | ICD-10-CM | POA: Diagnosis not present

## 2016-10-09 DIAGNOSIS — J3081 Allergic rhinitis due to animal (cat) (dog) hair and dander: Secondary | ICD-10-CM | POA: Diagnosis not present

## 2016-10-09 DIAGNOSIS — L249 Irritant contact dermatitis, unspecified cause: Secondary | ICD-10-CM | POA: Diagnosis not present

## 2016-10-09 DIAGNOSIS — J301 Allergic rhinitis due to pollen: Secondary | ICD-10-CM | POA: Diagnosis not present

## 2016-10-16 DIAGNOSIS — J3089 Other allergic rhinitis: Secondary | ICD-10-CM | POA: Diagnosis not present

## 2016-10-16 DIAGNOSIS — J3081 Allergic rhinitis due to animal (cat) (dog) hair and dander: Secondary | ICD-10-CM | POA: Diagnosis not present

## 2016-10-16 DIAGNOSIS — J301 Allergic rhinitis due to pollen: Secondary | ICD-10-CM | POA: Diagnosis not present

## 2016-10-23 DIAGNOSIS — J3089 Other allergic rhinitis: Secondary | ICD-10-CM | POA: Diagnosis not present

## 2016-10-23 DIAGNOSIS — J301 Allergic rhinitis due to pollen: Secondary | ICD-10-CM | POA: Diagnosis not present

## 2016-10-23 DIAGNOSIS — J3081 Allergic rhinitis due to animal (cat) (dog) hair and dander: Secondary | ICD-10-CM | POA: Diagnosis not present

## 2016-10-25 DIAGNOSIS — E7800 Pure hypercholesterolemia, unspecified: Secondary | ICD-10-CM | POA: Diagnosis not present

## 2016-10-25 DIAGNOSIS — I10 Essential (primary) hypertension: Secondary | ICD-10-CM | POA: Diagnosis not present

## 2016-10-25 DIAGNOSIS — J309 Allergic rhinitis, unspecified: Secondary | ICD-10-CM | POA: Diagnosis not present

## 2016-10-25 DIAGNOSIS — Z136 Encounter for screening for cardiovascular disorders: Secondary | ICD-10-CM | POA: Diagnosis not present

## 2016-10-25 DIAGNOSIS — Z0000 Encounter for general adult medical examination without abnormal findings: Secondary | ICD-10-CM | POA: Diagnosis not present

## 2016-10-25 DIAGNOSIS — F411 Generalized anxiety disorder: Secondary | ICD-10-CM | POA: Diagnosis not present

## 2016-10-25 DIAGNOSIS — E559 Vitamin D deficiency, unspecified: Secondary | ICD-10-CM | POA: Diagnosis not present

## 2016-10-31 DIAGNOSIS — J301 Allergic rhinitis due to pollen: Secondary | ICD-10-CM | POA: Diagnosis not present

## 2016-10-31 DIAGNOSIS — J3081 Allergic rhinitis due to animal (cat) (dog) hair and dander: Secondary | ICD-10-CM | POA: Diagnosis not present

## 2016-10-31 DIAGNOSIS — J3089 Other allergic rhinitis: Secondary | ICD-10-CM | POA: Diagnosis not present

## 2016-11-07 DIAGNOSIS — J3081 Allergic rhinitis due to animal (cat) (dog) hair and dander: Secondary | ICD-10-CM | POA: Diagnosis not present

## 2016-11-07 DIAGNOSIS — J301 Allergic rhinitis due to pollen: Secondary | ICD-10-CM | POA: Diagnosis not present

## 2016-11-07 DIAGNOSIS — J3089 Other allergic rhinitis: Secondary | ICD-10-CM | POA: Diagnosis not present

## 2016-11-12 ENCOUNTER — Other Ambulatory Visit: Payer: Self-pay | Admitting: Family Medicine

## 2016-11-12 DIAGNOSIS — Z1231 Encounter for screening mammogram for malignant neoplasm of breast: Secondary | ICD-10-CM

## 2016-11-14 DIAGNOSIS — J3081 Allergic rhinitis due to animal (cat) (dog) hair and dander: Secondary | ICD-10-CM | POA: Diagnosis not present

## 2016-11-14 DIAGNOSIS — J3089 Other allergic rhinitis: Secondary | ICD-10-CM | POA: Diagnosis not present

## 2016-11-14 DIAGNOSIS — J301 Allergic rhinitis due to pollen: Secondary | ICD-10-CM | POA: Diagnosis not present

## 2016-11-21 DIAGNOSIS — J3081 Allergic rhinitis due to animal (cat) (dog) hair and dander: Secondary | ICD-10-CM | POA: Diagnosis not present

## 2016-11-21 DIAGNOSIS — J301 Allergic rhinitis due to pollen: Secondary | ICD-10-CM | POA: Diagnosis not present

## 2016-11-21 DIAGNOSIS — J3089 Other allergic rhinitis: Secondary | ICD-10-CM | POA: Diagnosis not present

## 2016-11-28 DIAGNOSIS — J301 Allergic rhinitis due to pollen: Secondary | ICD-10-CM | POA: Diagnosis not present

## 2016-11-28 DIAGNOSIS — J3089 Other allergic rhinitis: Secondary | ICD-10-CM | POA: Diagnosis not present

## 2016-11-28 DIAGNOSIS — J3081 Allergic rhinitis due to animal (cat) (dog) hair and dander: Secondary | ICD-10-CM | POA: Diagnosis not present

## 2016-11-30 DIAGNOSIS — E782 Mixed hyperlipidemia: Secondary | ICD-10-CM | POA: Diagnosis not present

## 2016-11-30 DIAGNOSIS — I10 Essential (primary) hypertension: Secondary | ICD-10-CM | POA: Diagnosis not present

## 2016-11-30 DIAGNOSIS — I2510 Atherosclerotic heart disease of native coronary artery without angina pectoris: Secondary | ICD-10-CM | POA: Diagnosis not present

## 2016-12-05 DIAGNOSIS — J3081 Allergic rhinitis due to animal (cat) (dog) hair and dander: Secondary | ICD-10-CM | POA: Diagnosis not present

## 2016-12-05 DIAGNOSIS — J3089 Other allergic rhinitis: Secondary | ICD-10-CM | POA: Diagnosis not present

## 2016-12-05 DIAGNOSIS — J301 Allergic rhinitis due to pollen: Secondary | ICD-10-CM | POA: Diagnosis not present

## 2016-12-06 DIAGNOSIS — J3089 Other allergic rhinitis: Secondary | ICD-10-CM | POA: Diagnosis not present

## 2016-12-06 DIAGNOSIS — J301 Allergic rhinitis due to pollen: Secondary | ICD-10-CM | POA: Diagnosis not present

## 2016-12-06 DIAGNOSIS — J3081 Allergic rhinitis due to animal (cat) (dog) hair and dander: Secondary | ICD-10-CM | POA: Diagnosis not present

## 2016-12-06 DIAGNOSIS — T63443A Toxic effect of venom of bees, assault, initial encounter: Secondary | ICD-10-CM | POA: Diagnosis not present

## 2016-12-12 DIAGNOSIS — J3081 Allergic rhinitis due to animal (cat) (dog) hair and dander: Secondary | ICD-10-CM | POA: Diagnosis not present

## 2016-12-12 DIAGNOSIS — J301 Allergic rhinitis due to pollen: Secondary | ICD-10-CM | POA: Diagnosis not present

## 2016-12-12 DIAGNOSIS — J3089 Other allergic rhinitis: Secondary | ICD-10-CM | POA: Diagnosis not present

## 2016-12-19 DIAGNOSIS — J3089 Other allergic rhinitis: Secondary | ICD-10-CM | POA: Diagnosis not present

## 2016-12-19 DIAGNOSIS — J3081 Allergic rhinitis due to animal (cat) (dog) hair and dander: Secondary | ICD-10-CM | POA: Diagnosis not present

## 2016-12-19 DIAGNOSIS — J301 Allergic rhinitis due to pollen: Secondary | ICD-10-CM | POA: Diagnosis not present

## 2016-12-24 DIAGNOSIS — H10413 Chronic giant papillary conjunctivitis, bilateral: Secondary | ICD-10-CM | POA: Diagnosis not present

## 2016-12-26 DIAGNOSIS — J3081 Allergic rhinitis due to animal (cat) (dog) hair and dander: Secondary | ICD-10-CM | POA: Diagnosis not present

## 2016-12-26 DIAGNOSIS — J3089 Other allergic rhinitis: Secondary | ICD-10-CM | POA: Diagnosis not present

## 2016-12-26 DIAGNOSIS — J301 Allergic rhinitis due to pollen: Secondary | ICD-10-CM | POA: Diagnosis not present

## 2016-12-31 DIAGNOSIS — Z1283 Encounter for screening for malignant neoplasm of skin: Secondary | ICD-10-CM | POA: Diagnosis not present

## 2016-12-31 DIAGNOSIS — L820 Inflamed seborrheic keratosis: Secondary | ICD-10-CM | POA: Diagnosis not present

## 2016-12-31 DIAGNOSIS — L821 Other seborrheic keratosis: Secondary | ICD-10-CM | POA: Diagnosis not present

## 2017-01-02 DIAGNOSIS — J3081 Allergic rhinitis due to animal (cat) (dog) hair and dander: Secondary | ICD-10-CM | POA: Diagnosis not present

## 2017-01-02 DIAGNOSIS — J3089 Other allergic rhinitis: Secondary | ICD-10-CM | POA: Diagnosis not present

## 2017-01-02 DIAGNOSIS — J301 Allergic rhinitis due to pollen: Secondary | ICD-10-CM | POA: Diagnosis not present

## 2017-01-08 DIAGNOSIS — Z23 Encounter for immunization: Secondary | ICD-10-CM | POA: Diagnosis not present

## 2017-01-09 DIAGNOSIS — J3081 Allergic rhinitis due to animal (cat) (dog) hair and dander: Secondary | ICD-10-CM | POA: Diagnosis not present

## 2017-01-09 DIAGNOSIS — J301 Allergic rhinitis due to pollen: Secondary | ICD-10-CM | POA: Diagnosis not present

## 2017-01-09 DIAGNOSIS — J3089 Other allergic rhinitis: Secondary | ICD-10-CM | POA: Diagnosis not present

## 2017-01-16 DIAGNOSIS — J301 Allergic rhinitis due to pollen: Secondary | ICD-10-CM | POA: Diagnosis not present

## 2017-01-16 DIAGNOSIS — J3089 Other allergic rhinitis: Secondary | ICD-10-CM | POA: Diagnosis not present

## 2017-01-16 DIAGNOSIS — J3081 Allergic rhinitis due to animal (cat) (dog) hair and dander: Secondary | ICD-10-CM | POA: Diagnosis not present

## 2017-01-25 DIAGNOSIS — F430 Acute stress reaction: Secondary | ICD-10-CM | POA: Diagnosis not present

## 2017-01-25 DIAGNOSIS — R05 Cough: Secondary | ICD-10-CM | POA: Diagnosis not present

## 2017-02-06 DIAGNOSIS — J301 Allergic rhinitis due to pollen: Secondary | ICD-10-CM | POA: Diagnosis not present

## 2017-02-06 DIAGNOSIS — J3089 Other allergic rhinitis: Secondary | ICD-10-CM | POA: Diagnosis not present

## 2017-02-06 DIAGNOSIS — T63443A Toxic effect of venom of bees, assault, initial encounter: Secondary | ICD-10-CM | POA: Diagnosis not present

## 2017-02-06 DIAGNOSIS — J3081 Allergic rhinitis due to animal (cat) (dog) hair and dander: Secondary | ICD-10-CM | POA: Diagnosis not present

## 2017-02-13 DIAGNOSIS — J3081 Allergic rhinitis due to animal (cat) (dog) hair and dander: Secondary | ICD-10-CM | POA: Diagnosis not present

## 2017-02-13 DIAGNOSIS — J301 Allergic rhinitis due to pollen: Secondary | ICD-10-CM | POA: Diagnosis not present

## 2017-02-13 DIAGNOSIS — J3089 Other allergic rhinitis: Secondary | ICD-10-CM | POA: Diagnosis not present

## 2017-02-20 DIAGNOSIS — J301 Allergic rhinitis due to pollen: Secondary | ICD-10-CM | POA: Diagnosis not present

## 2017-02-20 DIAGNOSIS — J3081 Allergic rhinitis due to animal (cat) (dog) hair and dander: Secondary | ICD-10-CM | POA: Diagnosis not present

## 2017-02-20 DIAGNOSIS — J3089 Other allergic rhinitis: Secondary | ICD-10-CM | POA: Diagnosis not present

## 2017-02-27 DIAGNOSIS — J301 Allergic rhinitis due to pollen: Secondary | ICD-10-CM | POA: Diagnosis not present

## 2017-02-27 DIAGNOSIS — J3081 Allergic rhinitis due to animal (cat) (dog) hair and dander: Secondary | ICD-10-CM | POA: Diagnosis not present

## 2017-02-27 DIAGNOSIS — J3089 Other allergic rhinitis: Secondary | ICD-10-CM | POA: Diagnosis not present

## 2017-03-04 ENCOUNTER — Ambulatory Visit
Admission: RE | Admit: 2017-03-04 | Discharge: 2017-03-04 | Disposition: A | Discharge status: Home / Self Care | Payer: Medicare Other | Source: Ambulatory Visit | Attending: Family Medicine | Admitting: Family Medicine

## 2017-03-04 DIAGNOSIS — Z1231 Encounter for screening mammogram for malignant neoplasm of breast: Secondary | ICD-10-CM | POA: Diagnosis not present

## 2017-03-06 DIAGNOSIS — J3081 Allergic rhinitis due to animal (cat) (dog) hair and dander: Secondary | ICD-10-CM | POA: Diagnosis not present

## 2017-03-06 DIAGNOSIS — J3089 Other allergic rhinitis: Secondary | ICD-10-CM | POA: Diagnosis not present

## 2017-03-06 DIAGNOSIS — J301 Allergic rhinitis due to pollen: Secondary | ICD-10-CM | POA: Diagnosis not present

## 2017-03-13 DIAGNOSIS — J3081 Allergic rhinitis due to animal (cat) (dog) hair and dander: Secondary | ICD-10-CM | POA: Diagnosis not present

## 2017-03-13 DIAGNOSIS — J3089 Other allergic rhinitis: Secondary | ICD-10-CM | POA: Diagnosis not present

## 2017-03-13 DIAGNOSIS — J301 Allergic rhinitis due to pollen: Secondary | ICD-10-CM | POA: Diagnosis not present

## 2017-03-14 DIAGNOSIS — H401132 Primary open-angle glaucoma, bilateral, moderate stage: Secondary | ICD-10-CM | POA: Diagnosis not present

## 2017-03-19 DIAGNOSIS — J3089 Other allergic rhinitis: Secondary | ICD-10-CM | POA: Diagnosis not present

## 2017-03-20 DIAGNOSIS — J3089 Other allergic rhinitis: Secondary | ICD-10-CM | POA: Diagnosis not present

## 2017-03-20 DIAGNOSIS — J3081 Allergic rhinitis due to animal (cat) (dog) hair and dander: Secondary | ICD-10-CM | POA: Diagnosis not present

## 2017-03-20 DIAGNOSIS — J301 Allergic rhinitis due to pollen: Secondary | ICD-10-CM | POA: Diagnosis not present

## 2017-03-27 DIAGNOSIS — J3089 Other allergic rhinitis: Secondary | ICD-10-CM | POA: Diagnosis not present

## 2017-03-27 DIAGNOSIS — J3081 Allergic rhinitis due to animal (cat) (dog) hair and dander: Secondary | ICD-10-CM | POA: Diagnosis not present

## 2017-03-27 DIAGNOSIS — J301 Allergic rhinitis due to pollen: Secondary | ICD-10-CM | POA: Diagnosis not present

## 2017-04-03 DIAGNOSIS — J3089 Other allergic rhinitis: Secondary | ICD-10-CM | POA: Diagnosis not present

## 2017-04-03 DIAGNOSIS — J301 Allergic rhinitis due to pollen: Secondary | ICD-10-CM | POA: Diagnosis not present

## 2017-04-03 DIAGNOSIS — J3081 Allergic rhinitis due to animal (cat) (dog) hair and dander: Secondary | ICD-10-CM | POA: Diagnosis not present

## 2017-04-10 DIAGNOSIS — J3089 Other allergic rhinitis: Secondary | ICD-10-CM | POA: Diagnosis not present

## 2017-04-10 DIAGNOSIS — J301 Allergic rhinitis due to pollen: Secondary | ICD-10-CM | POA: Diagnosis not present

## 2017-04-17 DIAGNOSIS — J3081 Allergic rhinitis due to animal (cat) (dog) hair and dander: Secondary | ICD-10-CM | POA: Diagnosis not present

## 2017-04-17 DIAGNOSIS — J3089 Other allergic rhinitis: Secondary | ICD-10-CM | POA: Diagnosis not present

## 2017-04-17 DIAGNOSIS — J301 Allergic rhinitis due to pollen: Secondary | ICD-10-CM | POA: Diagnosis not present

## 2017-04-23 DIAGNOSIS — J3089 Other allergic rhinitis: Secondary | ICD-10-CM | POA: Diagnosis not present

## 2017-04-23 DIAGNOSIS — J3081 Allergic rhinitis due to animal (cat) (dog) hair and dander: Secondary | ICD-10-CM | POA: Diagnosis not present

## 2017-04-23 DIAGNOSIS — J301 Allergic rhinitis due to pollen: Secondary | ICD-10-CM | POA: Diagnosis not present

## 2017-04-23 DIAGNOSIS — T63443A Toxic effect of venom of bees, assault, initial encounter: Secondary | ICD-10-CM | POA: Diagnosis not present

## 2017-04-23 DIAGNOSIS — R062 Wheezing: Secondary | ICD-10-CM | POA: Diagnosis not present

## 2017-05-02 DIAGNOSIS — J3089 Other allergic rhinitis: Secondary | ICD-10-CM | POA: Diagnosis not present

## 2017-05-02 DIAGNOSIS — J3081 Allergic rhinitis due to animal (cat) (dog) hair and dander: Secondary | ICD-10-CM | POA: Diagnosis not present

## 2017-05-02 DIAGNOSIS — J301 Allergic rhinitis due to pollen: Secondary | ICD-10-CM | POA: Diagnosis not present

## 2017-05-07 DIAGNOSIS — J301 Allergic rhinitis due to pollen: Secondary | ICD-10-CM | POA: Diagnosis not present

## 2017-05-07 DIAGNOSIS — J3081 Allergic rhinitis due to animal (cat) (dog) hair and dander: Secondary | ICD-10-CM | POA: Diagnosis not present

## 2017-05-07 DIAGNOSIS — J3089 Other allergic rhinitis: Secondary | ICD-10-CM | POA: Diagnosis not present

## 2017-05-14 DIAGNOSIS — J3081 Allergic rhinitis due to animal (cat) (dog) hair and dander: Secondary | ICD-10-CM | POA: Diagnosis not present

## 2017-05-14 DIAGNOSIS — J301 Allergic rhinitis due to pollen: Secondary | ICD-10-CM | POA: Diagnosis not present

## 2017-05-14 DIAGNOSIS — J3089 Other allergic rhinitis: Secondary | ICD-10-CM | POA: Diagnosis not present

## 2017-05-16 DIAGNOSIS — F411 Generalized anxiety disorder: Secondary | ICD-10-CM | POA: Diagnosis not present

## 2017-05-20 DIAGNOSIS — R05 Cough: Secondary | ICD-10-CM | POA: Diagnosis not present

## 2017-05-28 DIAGNOSIS — J3089 Other allergic rhinitis: Secondary | ICD-10-CM | POA: Diagnosis not present

## 2017-05-28 DIAGNOSIS — J301 Allergic rhinitis due to pollen: Secondary | ICD-10-CM | POA: Diagnosis not present

## 2017-05-28 DIAGNOSIS — J3081 Allergic rhinitis due to animal (cat) (dog) hair and dander: Secondary | ICD-10-CM | POA: Diagnosis not present

## 2017-06-03 DIAGNOSIS — E782 Mixed hyperlipidemia: Secondary | ICD-10-CM | POA: Diagnosis not present

## 2017-06-03 DIAGNOSIS — I10 Essential (primary) hypertension: Secondary | ICD-10-CM | POA: Diagnosis not present

## 2017-06-04 DIAGNOSIS — J3081 Allergic rhinitis due to animal (cat) (dog) hair and dander: Secondary | ICD-10-CM | POA: Diagnosis not present

## 2017-06-04 DIAGNOSIS — J301 Allergic rhinitis due to pollen: Secondary | ICD-10-CM | POA: Diagnosis not present

## 2017-06-04 DIAGNOSIS — J3089 Other allergic rhinitis: Secondary | ICD-10-CM | POA: Diagnosis not present

## 2017-06-11 DIAGNOSIS — J3081 Allergic rhinitis due to animal (cat) (dog) hair and dander: Secondary | ICD-10-CM | POA: Diagnosis not present

## 2017-06-11 DIAGNOSIS — J301 Allergic rhinitis due to pollen: Secondary | ICD-10-CM | POA: Diagnosis not present

## 2017-06-11 DIAGNOSIS — J3089 Other allergic rhinitis: Secondary | ICD-10-CM | POA: Diagnosis not present

## 2017-06-18 DIAGNOSIS — J3089 Other allergic rhinitis: Secondary | ICD-10-CM | POA: Diagnosis not present

## 2017-06-18 DIAGNOSIS — J301 Allergic rhinitis due to pollen: Secondary | ICD-10-CM | POA: Diagnosis not present

## 2017-06-18 DIAGNOSIS — J3081 Allergic rhinitis due to animal (cat) (dog) hair and dander: Secondary | ICD-10-CM | POA: Diagnosis not present

## 2017-06-25 DIAGNOSIS — J301 Allergic rhinitis due to pollen: Secondary | ICD-10-CM | POA: Diagnosis not present

## 2017-06-25 DIAGNOSIS — J3089 Other allergic rhinitis: Secondary | ICD-10-CM | POA: Diagnosis not present

## 2017-06-25 DIAGNOSIS — J3081 Allergic rhinitis due to animal (cat) (dog) hair and dander: Secondary | ICD-10-CM | POA: Diagnosis not present

## 2017-07-02 DIAGNOSIS — J3089 Other allergic rhinitis: Secondary | ICD-10-CM | POA: Diagnosis not present

## 2017-07-02 DIAGNOSIS — J3081 Allergic rhinitis due to animal (cat) (dog) hair and dander: Secondary | ICD-10-CM | POA: Diagnosis not present

## 2017-07-02 DIAGNOSIS — J301 Allergic rhinitis due to pollen: Secondary | ICD-10-CM | POA: Diagnosis not present

## 2017-07-09 DIAGNOSIS — J301 Allergic rhinitis due to pollen: Secondary | ICD-10-CM | POA: Diagnosis not present

## 2017-07-09 DIAGNOSIS — J3081 Allergic rhinitis due to animal (cat) (dog) hair and dander: Secondary | ICD-10-CM | POA: Diagnosis not present

## 2017-07-09 DIAGNOSIS — J3089 Other allergic rhinitis: Secondary | ICD-10-CM | POA: Diagnosis not present

## 2017-07-15 DIAGNOSIS — H401132 Primary open-angle glaucoma, bilateral, moderate stage: Secondary | ICD-10-CM | POA: Diagnosis not present

## 2017-07-16 DIAGNOSIS — J301 Allergic rhinitis due to pollen: Secondary | ICD-10-CM | POA: Diagnosis not present

## 2017-07-16 DIAGNOSIS — J3089 Other allergic rhinitis: Secondary | ICD-10-CM | POA: Diagnosis not present

## 2017-07-16 DIAGNOSIS — J3081 Allergic rhinitis due to animal (cat) (dog) hair and dander: Secondary | ICD-10-CM | POA: Diagnosis not present

## 2017-07-23 DIAGNOSIS — J3081 Allergic rhinitis due to animal (cat) (dog) hair and dander: Secondary | ICD-10-CM | POA: Diagnosis not present

## 2017-07-23 DIAGNOSIS — J301 Allergic rhinitis due to pollen: Secondary | ICD-10-CM | POA: Diagnosis not present

## 2017-07-23 DIAGNOSIS — J3089 Other allergic rhinitis: Secondary | ICD-10-CM | POA: Diagnosis not present

## 2017-07-30 DIAGNOSIS — J3081 Allergic rhinitis due to animal (cat) (dog) hair and dander: Secondary | ICD-10-CM | POA: Diagnosis not present

## 2017-07-30 DIAGNOSIS — J301 Allergic rhinitis due to pollen: Secondary | ICD-10-CM | POA: Diagnosis not present

## 2017-07-30 DIAGNOSIS — J3089 Other allergic rhinitis: Secondary | ICD-10-CM | POA: Diagnosis not present

## 2017-08-06 DIAGNOSIS — J3081 Allergic rhinitis due to animal (cat) (dog) hair and dander: Secondary | ICD-10-CM | POA: Diagnosis not present

## 2017-08-06 DIAGNOSIS — J3089 Other allergic rhinitis: Secondary | ICD-10-CM | POA: Diagnosis not present

## 2017-08-06 DIAGNOSIS — J301 Allergic rhinitis due to pollen: Secondary | ICD-10-CM | POA: Diagnosis not present

## 2017-08-13 DIAGNOSIS — J3089 Other allergic rhinitis: Secondary | ICD-10-CM | POA: Diagnosis not present

## 2017-08-13 DIAGNOSIS — J3081 Allergic rhinitis due to animal (cat) (dog) hair and dander: Secondary | ICD-10-CM | POA: Diagnosis not present

## 2017-08-13 DIAGNOSIS — J301 Allergic rhinitis due to pollen: Secondary | ICD-10-CM | POA: Diagnosis not present

## 2017-08-14 DIAGNOSIS — M25579 Pain in unspecified ankle and joints of unspecified foot: Secondary | ICD-10-CM | POA: Diagnosis not present

## 2017-08-14 DIAGNOSIS — M1990 Unspecified osteoarthritis, unspecified site: Secondary | ICD-10-CM | POA: Diagnosis not present

## 2017-08-14 DIAGNOSIS — R609 Edema, unspecified: Secondary | ICD-10-CM | POA: Diagnosis not present

## 2017-08-20 DIAGNOSIS — J3089 Other allergic rhinitis: Secondary | ICD-10-CM | POA: Diagnosis not present

## 2017-08-20 DIAGNOSIS — J301 Allergic rhinitis due to pollen: Secondary | ICD-10-CM | POA: Diagnosis not present

## 2017-08-20 DIAGNOSIS — J3081 Allergic rhinitis due to animal (cat) (dog) hair and dander: Secondary | ICD-10-CM | POA: Diagnosis not present

## 2017-08-27 DIAGNOSIS — J3081 Allergic rhinitis due to animal (cat) (dog) hair and dander: Secondary | ICD-10-CM | POA: Diagnosis not present

## 2017-08-27 DIAGNOSIS — J301 Allergic rhinitis due to pollen: Secondary | ICD-10-CM | POA: Diagnosis not present

## 2017-08-27 DIAGNOSIS — J3089 Other allergic rhinitis: Secondary | ICD-10-CM | POA: Diagnosis not present

## 2017-08-28 DIAGNOSIS — J3081 Allergic rhinitis due to animal (cat) (dog) hair and dander: Secondary | ICD-10-CM | POA: Diagnosis not present

## 2017-08-28 DIAGNOSIS — J301 Allergic rhinitis due to pollen: Secondary | ICD-10-CM | POA: Diagnosis not present

## 2017-09-03 DIAGNOSIS — J3089 Other allergic rhinitis: Secondary | ICD-10-CM | POA: Diagnosis not present

## 2017-09-03 DIAGNOSIS — J301 Allergic rhinitis due to pollen: Secondary | ICD-10-CM | POA: Diagnosis not present

## 2017-09-03 DIAGNOSIS — J3081 Allergic rhinitis due to animal (cat) (dog) hair and dander: Secondary | ICD-10-CM | POA: Diagnosis not present

## 2017-09-05 DIAGNOSIS — J3089 Other allergic rhinitis: Secondary | ICD-10-CM | POA: Diagnosis not present

## 2017-09-05 DIAGNOSIS — J3081 Allergic rhinitis due to animal (cat) (dog) hair and dander: Secondary | ICD-10-CM | POA: Diagnosis not present

## 2017-09-05 DIAGNOSIS — J301 Allergic rhinitis due to pollen: Secondary | ICD-10-CM | POA: Diagnosis not present

## 2017-09-10 DIAGNOSIS — J301 Allergic rhinitis due to pollen: Secondary | ICD-10-CM | POA: Diagnosis not present

## 2017-09-10 DIAGNOSIS — J3089 Other allergic rhinitis: Secondary | ICD-10-CM | POA: Diagnosis not present

## 2017-09-10 DIAGNOSIS — J3081 Allergic rhinitis due to animal (cat) (dog) hair and dander: Secondary | ICD-10-CM | POA: Diagnosis not present

## 2017-09-12 DIAGNOSIS — J3089 Other allergic rhinitis: Secondary | ICD-10-CM | POA: Diagnosis not present

## 2017-09-12 DIAGNOSIS — J301 Allergic rhinitis due to pollen: Secondary | ICD-10-CM | POA: Diagnosis not present

## 2017-09-12 DIAGNOSIS — J3081 Allergic rhinitis due to animal (cat) (dog) hair and dander: Secondary | ICD-10-CM | POA: Diagnosis not present

## 2017-09-17 DIAGNOSIS — J3089 Other allergic rhinitis: Secondary | ICD-10-CM | POA: Diagnosis not present

## 2017-09-17 DIAGNOSIS — J301 Allergic rhinitis due to pollen: Secondary | ICD-10-CM | POA: Diagnosis not present

## 2017-09-19 DIAGNOSIS — J301 Allergic rhinitis due to pollen: Secondary | ICD-10-CM | POA: Diagnosis not present

## 2017-09-19 DIAGNOSIS — J3089 Other allergic rhinitis: Secondary | ICD-10-CM | POA: Diagnosis not present

## 2017-09-24 DIAGNOSIS — J3081 Allergic rhinitis due to animal (cat) (dog) hair and dander: Secondary | ICD-10-CM | POA: Diagnosis not present

## 2017-09-24 DIAGNOSIS — J3089 Other allergic rhinitis: Secondary | ICD-10-CM | POA: Diagnosis not present

## 2017-09-24 DIAGNOSIS — J301 Allergic rhinitis due to pollen: Secondary | ICD-10-CM | POA: Diagnosis not present

## 2017-10-01 DIAGNOSIS — J3081 Allergic rhinitis due to animal (cat) (dog) hair and dander: Secondary | ICD-10-CM | POA: Diagnosis not present

## 2017-10-01 DIAGNOSIS — J301 Allergic rhinitis due to pollen: Secondary | ICD-10-CM | POA: Diagnosis not present

## 2017-10-01 DIAGNOSIS — J3089 Other allergic rhinitis: Secondary | ICD-10-CM | POA: Diagnosis not present

## 2017-10-08 DIAGNOSIS — J301 Allergic rhinitis due to pollen: Secondary | ICD-10-CM | POA: Diagnosis not present

## 2017-10-08 DIAGNOSIS — J3089 Other allergic rhinitis: Secondary | ICD-10-CM | POA: Diagnosis not present

## 2017-10-08 DIAGNOSIS — J3081 Allergic rhinitis due to animal (cat) (dog) hair and dander: Secondary | ICD-10-CM | POA: Diagnosis not present

## 2017-10-14 DIAGNOSIS — M545 Low back pain: Secondary | ICD-10-CM | POA: Diagnosis not present

## 2017-10-14 DIAGNOSIS — M25552 Pain in left hip: Secondary | ICD-10-CM | POA: Diagnosis not present

## 2017-10-15 DIAGNOSIS — J3089 Other allergic rhinitis: Secondary | ICD-10-CM | POA: Diagnosis not present

## 2017-10-15 DIAGNOSIS — J3081 Allergic rhinitis due to animal (cat) (dog) hair and dander: Secondary | ICD-10-CM | POA: Diagnosis not present

## 2017-10-15 DIAGNOSIS — J301 Allergic rhinitis due to pollen: Secondary | ICD-10-CM | POA: Diagnosis not present

## 2017-10-22 DIAGNOSIS — J3081 Allergic rhinitis due to animal (cat) (dog) hair and dander: Secondary | ICD-10-CM | POA: Diagnosis not present

## 2017-10-22 DIAGNOSIS — J3089 Other allergic rhinitis: Secondary | ICD-10-CM | POA: Diagnosis not present

## 2017-10-22 DIAGNOSIS — J301 Allergic rhinitis due to pollen: Secondary | ICD-10-CM | POA: Diagnosis not present

## 2017-10-29 DIAGNOSIS — J3089 Other allergic rhinitis: Secondary | ICD-10-CM | POA: Diagnosis not present

## 2017-10-29 DIAGNOSIS — J3081 Allergic rhinitis due to animal (cat) (dog) hair and dander: Secondary | ICD-10-CM | POA: Diagnosis not present

## 2017-10-29 DIAGNOSIS — J301 Allergic rhinitis due to pollen: Secondary | ICD-10-CM | POA: Diagnosis not present

## 2017-10-30 DIAGNOSIS — M13872 Other specified arthritis, left ankle and foot: Secondary | ICD-10-CM | POA: Diagnosis not present

## 2017-10-30 DIAGNOSIS — M25572 Pain in left ankle and joints of left foot: Secondary | ICD-10-CM | POA: Diagnosis not present

## 2017-10-30 DIAGNOSIS — M19072 Primary osteoarthritis, left ankle and foot: Secondary | ICD-10-CM | POA: Diagnosis not present

## 2017-10-30 DIAGNOSIS — M76822 Posterior tibial tendinitis, left leg: Secondary | ICD-10-CM | POA: Diagnosis not present

## 2017-10-31 DIAGNOSIS — J3089 Other allergic rhinitis: Secondary | ICD-10-CM | POA: Diagnosis not present

## 2017-11-05 DIAGNOSIS — J301 Allergic rhinitis due to pollen: Secondary | ICD-10-CM | POA: Diagnosis not present

## 2017-11-05 DIAGNOSIS — J3089 Other allergic rhinitis: Secondary | ICD-10-CM | POA: Diagnosis not present

## 2017-11-05 DIAGNOSIS — J3081 Allergic rhinitis due to animal (cat) (dog) hair and dander: Secondary | ICD-10-CM | POA: Diagnosis not present

## 2017-11-11 DIAGNOSIS — H401132 Primary open-angle glaucoma, bilateral, moderate stage: Secondary | ICD-10-CM | POA: Diagnosis not present

## 2017-11-12 DIAGNOSIS — J3089 Other allergic rhinitis: Secondary | ICD-10-CM | POA: Diagnosis not present

## 2017-11-12 DIAGNOSIS — J301 Allergic rhinitis due to pollen: Secondary | ICD-10-CM | POA: Diagnosis not present

## 2017-11-12 DIAGNOSIS — J3081 Allergic rhinitis due to animal (cat) (dog) hair and dander: Secondary | ICD-10-CM | POA: Diagnosis not present

## 2017-11-15 DIAGNOSIS — M19072 Primary osteoarthritis, left ankle and foot: Secondary | ICD-10-CM | POA: Diagnosis not present

## 2017-11-19 DIAGNOSIS — J3081 Allergic rhinitis due to animal (cat) (dog) hair and dander: Secondary | ICD-10-CM | POA: Diagnosis not present

## 2017-11-19 DIAGNOSIS — J3089 Other allergic rhinitis: Secondary | ICD-10-CM | POA: Diagnosis not present

## 2017-11-19 DIAGNOSIS — J301 Allergic rhinitis due to pollen: Secondary | ICD-10-CM | POA: Diagnosis not present

## 2017-11-26 DIAGNOSIS — E559 Vitamin D deficiency, unspecified: Secondary | ICD-10-CM | POA: Diagnosis not present

## 2017-11-26 DIAGNOSIS — M810 Age-related osteoporosis without current pathological fracture: Secondary | ICD-10-CM | POA: Diagnosis not present

## 2017-11-26 DIAGNOSIS — J3089 Other allergic rhinitis: Secondary | ICD-10-CM | POA: Diagnosis not present

## 2017-11-26 DIAGNOSIS — I10 Essential (primary) hypertension: Secondary | ICD-10-CM | POA: Diagnosis not present

## 2017-11-26 DIAGNOSIS — J301 Allergic rhinitis due to pollen: Secondary | ICD-10-CM | POA: Diagnosis not present

## 2017-11-26 DIAGNOSIS — J3081 Allergic rhinitis due to animal (cat) (dog) hair and dander: Secondary | ICD-10-CM | POA: Diagnosis not present

## 2017-12-02 DIAGNOSIS — I10 Essential (primary) hypertension: Secondary | ICD-10-CM | POA: Diagnosis not present

## 2017-12-02 DIAGNOSIS — M810 Age-related osteoporosis without current pathological fracture: Secondary | ICD-10-CM | POA: Diagnosis not present

## 2017-12-02 DIAGNOSIS — E559 Vitamin D deficiency, unspecified: Secondary | ICD-10-CM | POA: Diagnosis not present

## 2017-12-02 DIAGNOSIS — G250 Essential tremor: Secondary | ICD-10-CM | POA: Diagnosis not present

## 2017-12-02 DIAGNOSIS — F411 Generalized anxiety disorder: Secondary | ICD-10-CM | POA: Diagnosis not present

## 2017-12-02 DIAGNOSIS — Z0000 Encounter for general adult medical examination without abnormal findings: Secondary | ICD-10-CM | POA: Diagnosis not present

## 2017-12-03 DIAGNOSIS — J3089 Other allergic rhinitis: Secondary | ICD-10-CM | POA: Diagnosis not present

## 2017-12-03 DIAGNOSIS — J301 Allergic rhinitis due to pollen: Secondary | ICD-10-CM | POA: Diagnosis not present

## 2017-12-03 DIAGNOSIS — J3081 Allergic rhinitis due to animal (cat) (dog) hair and dander: Secondary | ICD-10-CM | POA: Diagnosis not present

## 2017-12-05 ENCOUNTER — Encounter: Payer: Self-pay | Admitting: Neurology

## 2017-12-10 DIAGNOSIS — J3081 Allergic rhinitis due to animal (cat) (dog) hair and dander: Secondary | ICD-10-CM | POA: Diagnosis not present

## 2017-12-10 DIAGNOSIS — J301 Allergic rhinitis due to pollen: Secondary | ICD-10-CM | POA: Diagnosis not present

## 2017-12-10 DIAGNOSIS — J3089 Other allergic rhinitis: Secondary | ICD-10-CM | POA: Diagnosis not present

## 2017-12-17 DIAGNOSIS — J3089 Other allergic rhinitis: Secondary | ICD-10-CM | POA: Diagnosis not present

## 2017-12-17 DIAGNOSIS — J301 Allergic rhinitis due to pollen: Secondary | ICD-10-CM | POA: Diagnosis not present

## 2017-12-17 DIAGNOSIS — J3081 Allergic rhinitis due to animal (cat) (dog) hair and dander: Secondary | ICD-10-CM | POA: Diagnosis not present

## 2017-12-18 DIAGNOSIS — I2510 Atherosclerotic heart disease of native coronary artery without angina pectoris: Secondary | ICD-10-CM | POA: Diagnosis not present

## 2017-12-18 DIAGNOSIS — E782 Mixed hyperlipidemia: Secondary | ICD-10-CM | POA: Diagnosis not present

## 2017-12-18 DIAGNOSIS — I2584 Coronary atherosclerosis due to calcified coronary lesion: Secondary | ICD-10-CM | POA: Diagnosis not present

## 2017-12-18 DIAGNOSIS — I10 Essential (primary) hypertension: Secondary | ICD-10-CM | POA: Diagnosis not present

## 2017-12-24 DIAGNOSIS — J3081 Allergic rhinitis due to animal (cat) (dog) hair and dander: Secondary | ICD-10-CM | POA: Diagnosis not present

## 2017-12-24 DIAGNOSIS — J301 Allergic rhinitis due to pollen: Secondary | ICD-10-CM | POA: Diagnosis not present

## 2017-12-24 DIAGNOSIS — J3089 Other allergic rhinitis: Secondary | ICD-10-CM | POA: Diagnosis not present

## 2017-12-24 NOTE — Progress Notes (Signed)
Subjective:   Annette Mathews was seen in consultation in the movement disorder clinic at the request of Lawerance Cruel, MD.  The evaluation is for tremor.  Records made available to me are reviewed, including her consultation with Dr. Rexene Alberts in 2015.  Tremor started approximately 13-15 years ago and involves the bilateral upper extremities.  Tremor is most noticeable when trying to feed self or pour drinks.  She has trouble with fine motor coordination.  "I'm having trouble fastening my damn bra."  The right and left hand both shake but the intensity of the tremor changes in which hand is worse.    Started with voice tremor around same time that it started in the hands. There is a strong family hx of tremor in her dad, daughter and brother.    Affected by caffeine:  Yes.   ("until Monday, I drank up to 3, 16-oz cokes per day) Affected by alcohol: unknown, as doesn't drink enough to know Affected by stress:  Yes.   Affected by fatigue:  Yes.   Spills soup if on spoon:  Sometimes, but she does have weighted silverware Spills glass of liquid if full:  Yes.   Affects ADL's (tying shoes, brushing teeth, etc):  Yes.    Current/Previously tried tremor medications: inderal LA - 80 mg tid but thought that referring notes stated that had to be d/c recently due to severe depression -  She states that she has not stopped and that "all meds go through my cardiologist in [place]."  She did just see cardiology on 12/18/17 and notes state that he may change from propranolol to carvedilol depending on recommendations from visit today; primidone, tried 1 dose by neurologist in [place] and had nausea and vomiting.  Asked to retry this again and stated that she developed diarrhea and states that she also didn't feel good on it; offered DBS consultation in the past and refused.   Outside reports reviewed: historical medical records, office notes and referral letter/letters.  Allergies  Allergen Reactions    . Montelukast Other (See Comments)    tremors  . Sertraline Other (See Comments)    TREMORS, INSOMNIA   . Tramadol Other (See Comments)    ITCHING, VOMITING.   . Codeine   . Erythromycin   . Hydrocodone-Acetaminophen   . Iodinated Diagnostic Agents   . Iodine-131 Hives  . Meperidine   . Montelukast Sodium   . Morphine Sulfate     Outpatient Encounter Medications as of 12/26/2017  Medication Sig  . ALPRAZolam (XANAX) 0.5 MG tablet 2 (two) times daily.  . AMOXICILLIN PO Take 2,000 mg by mouth. Every 6 months when going to dentist  . aspirin EC 81 MG tablet Take by mouth.  Marland Kitchen FLUVIRIN SUSP   . hydrochlorothiazide (HYDRODIURIL) 25 MG tablet TK 1 T PO PRN  . ipratropium (ATROVENT) 0.06 % nasal spray 2 drops in each nostril as needed  . latanoprost (XALATAN) 0.005 % ophthalmic solution 1 drop at bedtime. 1 drop both eyes daily  . losartan (COZAAR) 100 MG tablet TK 1 T PO QD  . losartan-hydrochlorothiazide (HYZAAR) 100-25 MG per tablet 1/2 tablet per day  . propranolol (INDERAL) 60 MG tablet Take 60 mg by mouth 2 (two) times daily.  . primidone (MYSOLINE) 50 MG tablet Take 1/4 pill each night for 1 week, then 1/4 pill every other night for 1 week, then stop. (Patient not taking: Reported on 12/26/2017)   No facility-administered encounter medications on file as of  12/26/2017.     Past Medical History:  Diagnosis Date  . Anxiety   . Glaucoma   . Hypertension     Past Surgical History:  Procedure Laterality Date  . FOOT SURGERY     multiple  . Hysterectomy    . KNEE SURGERY Bilateral    TKA    Social History   Socioeconomic History  . Marital status: Divorced    Spouse name: Not on file  . Number of children: 2  . Years of education: 16  . Highest education level: Bachelor's degree (e.g., BA, AB, BS)  Occupational History  . Occupation: retired    Fish farm manager: Banker    Comment: retired  . Occupation: retired Tour manager  . Financial resource strain: Not  on file  . Food insecurity:    Worry: Not on file    Inability: Not on file  . Transportation needs:    Medical: Not on file    Non-medical: Not on file  Tobacco Use  . Smoking status: Never Smoker  . Smokeless tobacco: Never Used  Substance and Sexual Activity  . Alcohol use: Yes    Comment: occas.  . Drug use: No  . Sexual activity: Not on file  Lifestyle  . Physical activity:    Days per week: Not on file    Minutes per session: Not on file  . Stress: Not on file  Relationships  . Social connections:    Talks on phone: Not on file    Gets together: Not on file    Attends religious service: Not on file    Active member of club or organization: Not on file    Attends meetings of clubs or organizations: Not on file    Relationship status: Not on file  . Intimate partner violence:    Fear of current or ex partner: Not on file    Emotionally abused: Not on file    Physically abused: Not on file    Forced sexual activity: Not on file  Other Topics Concern  . Not on file  Social History Narrative   Patient consumes 2-3 cups of coke daily, is right handed,resides alone in a one story home.  Has 2 children.    Family Status  Relation Name Status  . Mother  Deceased  . Father  Deceased       COPD  . Brother  Alive  . Child 2 Alive  . Neg Hx  (Not Specified)    Review of Systems Review of Systems  Constitutional: Negative.   HENT: Negative.   Eyes: Negative.   Gastrointestinal: Negative.   Genitourinary: Negative.   Musculoskeletal: Positive for joint pain (L ankle pain).  Skin: Negative.   Neurological: Positive for tremors.     Objective:   VITALS:   Vitals:   12/26/17 1307  BP: 110/70  Pulse: 68  SpO2: 96%  Height: 4\' 10"  (1.473 m)   Gen:  Appears stated age and in NAD. HEENT:  Normocephalic, atraumatic. The mucous membranes are moist. The superficial temporal arteries are without ropiness or tenderness. Cardiovascular: Regular rate and  rhythm. Lungs: Clear to auscultation bilaterally. Neck: There are no carotid bruits noted bilaterally.  NEUROLOGICAL:  Orientation:  The patient is alert and oriented x 3.  Recent and remote memory are intact.  Attention span and concentration are normal.  Able to name objects and repeat without trouble.  Fund of knowledge is appropriate Cranial nerves: There is good facial symmetry.  The pupils are equal round and reactive to light bilaterally. Fundoscopic exam reveals clear disc margins bilaterally. Extraocular muscles are intact and visual fields are full to confrontational testing. Speech is fluent and clear but she does have vocal tremor. Soft palate rises symmetrically and there is no tongue deviation. Hearing is intact to conversational tone. Tone: Tone is good throughout. Sensation: Sensation is intact to light touch throughout (facial, trunk, extremities). Vibration is intact at the bilateral big toe. There is no extinction with double simultaneous stimulation. There is no sensory dermatomal level identified. Coordination:  The patient has no dysdiadichokinesia or dysmetria. Motor: Strength is 5/5 in the bilateral upper and lower extremities (she does have R shoulder pain with MMT).  Shoulder shrug is equal bilaterally.  There is no pronator drift.  There are no fasciculations noted. DTR's: Deep tendon reflexes are 2-/4 at the bilateral biceps, triceps, brachioradialis, patella and achilles.  Plantar responses are downgoing bilaterally. Gait and Station: The patient is able to ambulate without difficulty. The patient is able to heel toe walk without any difficulty. The patient is able to ambulate in a tandem fashion. The patient is able to stand in the Romberg position.   MOVEMENT EXAM: Tremor:  There is no rest tremor.  There is postural tremor that is much worse with intention.  Tremor is worse when given a weight.  She has trouble with Archimedes spirals bilaterally.  She spills water all  over when asked report from one glass to another.     Assessment/Plan:   1.  Essential Tremor.  -This is evidenced by the symmetrical nature and longstanding hx of gradually getting worse.  We discussed nature and pathophysiology.  We discussed that this can continue to gradually get worse with time.  We discussed that some medications can worsen this, as can caffeine use.  We discussed medication therapy as well as surgical therapy.  She has been on beta-blocker therapy and is currently on a total of 160 mg of propranolol per day.  She thinks that that is helpful for tremor, as if she forgets it, she has significant tremor (she calls this gets "addictive nature").  Primary care records indicate that they were recommending discontinuing this because of depression, although I did not really note nor did she admit to that today.  She tried very small dosages of primidone (50 mg -1/2 to 1/4 tablet) and reported side effects (reported diarrhea and nausea/vomiting).  These are the only 2 first-line medications for essential tremor.  Many of the second line medications are second line drugs either because of side effect profile or because of lack of efficacy.  In addition, many of these are anticholinergic in nature and would involve more risk in her age group.  She is not interested in DBS therapy, nor is she interested in focused ultrasound, although we did talk about these things.  I told her that the second line drugs for the most part would not make an impact in her degree of tremor, with the exception of possibly trihexyphenidyl which have significantly more side effects in her age group.  She asked me to write the name down and she would talk to her cardiologist.  I have no objection to that, although cardiac SE are not something that are the issue with this but more confusion/balance change/hallucinations with artane.  -We discussed nonmedicinal/nonsurgical therapies such as weighted gloves, weighted  spoons, readi steadi glove.  2.  She will let me know how she would like  to proceed.  For now, she will be prn.  CC:  Lawerance Cruel, MD

## 2017-12-26 ENCOUNTER — Ambulatory Visit (INDEPENDENT_AMBULATORY_CARE_PROVIDER_SITE_OTHER): Payer: Medicare Other | Admitting: Neurology

## 2017-12-26 ENCOUNTER — Encounter: Payer: Self-pay | Admitting: Neurology

## 2017-12-26 VITALS — BP 110/70 | HR 68 | Ht <= 58 in

## 2017-12-26 DIAGNOSIS — G250 Essential tremor: Secondary | ICD-10-CM

## 2017-12-26 NOTE — Patient Instructions (Signed)
There are many second line meds, and they are second line either because of side effects or because of lack of effectiveness.  The only thing that may make an impact is trihexyphenidyl, but this medication can have side effects in your age group (confusion, balance issues, etc).  We could try this at very low dosage if you would like.  You can let me know if you would like to try it.

## 2017-12-31 DIAGNOSIS — J301 Allergic rhinitis due to pollen: Secondary | ICD-10-CM | POA: Diagnosis not present

## 2017-12-31 DIAGNOSIS — J3081 Allergic rhinitis due to animal (cat) (dog) hair and dander: Secondary | ICD-10-CM | POA: Diagnosis not present

## 2017-12-31 DIAGNOSIS — J3089 Other allergic rhinitis: Secondary | ICD-10-CM | POA: Diagnosis not present

## 2018-01-01 DIAGNOSIS — L821 Other seborrheic keratosis: Secondary | ICD-10-CM | POA: Diagnosis not present

## 2018-01-01 DIAGNOSIS — Z1283 Encounter for screening for malignant neoplasm of skin: Secondary | ICD-10-CM | POA: Diagnosis not present

## 2018-01-01 DIAGNOSIS — L308 Other specified dermatitis: Secondary | ICD-10-CM | POA: Diagnosis not present

## 2018-01-01 DIAGNOSIS — L820 Inflamed seborrheic keratosis: Secondary | ICD-10-CM | POA: Diagnosis not present

## 2018-01-07 DIAGNOSIS — J3089 Other allergic rhinitis: Secondary | ICD-10-CM | POA: Diagnosis not present

## 2018-01-07 DIAGNOSIS — J301 Allergic rhinitis due to pollen: Secondary | ICD-10-CM | POA: Diagnosis not present

## 2018-01-07 DIAGNOSIS — J3081 Allergic rhinitis due to animal (cat) (dog) hair and dander: Secondary | ICD-10-CM | POA: Diagnosis not present

## 2018-01-09 DIAGNOSIS — J3089 Other allergic rhinitis: Secondary | ICD-10-CM | POA: Diagnosis not present

## 2018-01-09 DIAGNOSIS — Z23 Encounter for immunization: Secondary | ICD-10-CM | POA: Diagnosis not present

## 2018-01-09 DIAGNOSIS — R062 Wheezing: Secondary | ICD-10-CM | POA: Diagnosis not present

## 2018-01-09 DIAGNOSIS — T63443A Toxic effect of venom of bees, assault, initial encounter: Secondary | ICD-10-CM | POA: Diagnosis not present

## 2018-01-09 DIAGNOSIS — J301 Allergic rhinitis due to pollen: Secondary | ICD-10-CM | POA: Diagnosis not present

## 2018-01-14 DIAGNOSIS — J3089 Other allergic rhinitis: Secondary | ICD-10-CM | POA: Diagnosis not present

## 2018-01-14 DIAGNOSIS — J3081 Allergic rhinitis due to animal (cat) (dog) hair and dander: Secondary | ICD-10-CM | POA: Diagnosis not present

## 2018-01-14 DIAGNOSIS — J301 Allergic rhinitis due to pollen: Secondary | ICD-10-CM | POA: Diagnosis not present

## 2018-01-21 DIAGNOSIS — J3081 Allergic rhinitis due to animal (cat) (dog) hair and dander: Secondary | ICD-10-CM | POA: Diagnosis not present

## 2018-01-21 DIAGNOSIS — J301 Allergic rhinitis due to pollen: Secondary | ICD-10-CM | POA: Diagnosis not present

## 2018-01-21 DIAGNOSIS — J3089 Other allergic rhinitis: Secondary | ICD-10-CM | POA: Diagnosis not present

## 2018-01-24 ENCOUNTER — Other Ambulatory Visit: Payer: Self-pay | Admitting: Family Medicine

## 2018-01-24 DIAGNOSIS — M810 Age-related osteoporosis without current pathological fracture: Secondary | ICD-10-CM

## 2018-01-24 DIAGNOSIS — Z1231 Encounter for screening mammogram for malignant neoplasm of breast: Secondary | ICD-10-CM

## 2018-01-28 DIAGNOSIS — J3081 Allergic rhinitis due to animal (cat) (dog) hair and dander: Secondary | ICD-10-CM | POA: Diagnosis not present

## 2018-01-28 DIAGNOSIS — J3089 Other allergic rhinitis: Secondary | ICD-10-CM | POA: Diagnosis not present

## 2018-01-28 DIAGNOSIS — J301 Allergic rhinitis due to pollen: Secondary | ICD-10-CM | POA: Diagnosis not present

## 2018-01-31 DIAGNOSIS — M79672 Pain in left foot: Secondary | ICD-10-CM | POA: Diagnosis not present

## 2018-01-31 DIAGNOSIS — M25572 Pain in left ankle and joints of left foot: Secondary | ICD-10-CM | POA: Diagnosis not present

## 2018-01-31 DIAGNOSIS — M76822 Posterior tibial tendinitis, left leg: Secondary | ICD-10-CM | POA: Diagnosis not present

## 2018-01-31 DIAGNOSIS — M19072 Primary osteoarthritis, left ankle and foot: Secondary | ICD-10-CM | POA: Diagnosis not present

## 2018-02-04 DIAGNOSIS — J301 Allergic rhinitis due to pollen: Secondary | ICD-10-CM | POA: Diagnosis not present

## 2018-02-04 DIAGNOSIS — J3081 Allergic rhinitis due to animal (cat) (dog) hair and dander: Secondary | ICD-10-CM | POA: Diagnosis not present

## 2018-02-04 DIAGNOSIS — L239 Allergic contact dermatitis, unspecified cause: Secondary | ICD-10-CM | POA: Diagnosis not present

## 2018-02-04 DIAGNOSIS — J3089 Other allergic rhinitis: Secondary | ICD-10-CM | POA: Diagnosis not present

## 2018-02-06 DIAGNOSIS — T63443A Toxic effect of venom of bees, assault, initial encounter: Secondary | ICD-10-CM | POA: Diagnosis not present

## 2018-02-06 DIAGNOSIS — J3081 Allergic rhinitis due to animal (cat) (dog) hair and dander: Secondary | ICD-10-CM | POA: Diagnosis not present

## 2018-02-06 DIAGNOSIS — J301 Allergic rhinitis due to pollen: Secondary | ICD-10-CM | POA: Diagnosis not present

## 2018-02-06 DIAGNOSIS — J3089 Other allergic rhinitis: Secondary | ICD-10-CM | POA: Diagnosis not present

## 2018-02-18 DIAGNOSIS — J3089 Other allergic rhinitis: Secondary | ICD-10-CM | POA: Diagnosis not present

## 2018-02-18 DIAGNOSIS — J3081 Allergic rhinitis due to animal (cat) (dog) hair and dander: Secondary | ICD-10-CM | POA: Diagnosis not present

## 2018-02-18 DIAGNOSIS — J301 Allergic rhinitis due to pollen: Secondary | ICD-10-CM | POA: Diagnosis not present

## 2018-02-25 DIAGNOSIS — J301 Allergic rhinitis due to pollen: Secondary | ICD-10-CM | POA: Diagnosis not present

## 2018-02-25 DIAGNOSIS — J3081 Allergic rhinitis due to animal (cat) (dog) hair and dander: Secondary | ICD-10-CM | POA: Diagnosis not present

## 2018-02-25 DIAGNOSIS — J3089 Other allergic rhinitis: Secondary | ICD-10-CM | POA: Diagnosis not present

## 2018-03-04 DIAGNOSIS — J301 Allergic rhinitis due to pollen: Secondary | ICD-10-CM | POA: Diagnosis not present

## 2018-03-04 DIAGNOSIS — J3089 Other allergic rhinitis: Secondary | ICD-10-CM | POA: Diagnosis not present

## 2018-03-04 DIAGNOSIS — J3081 Allergic rhinitis due to animal (cat) (dog) hair and dander: Secondary | ICD-10-CM | POA: Diagnosis not present

## 2018-03-10 ENCOUNTER — Ambulatory Visit
Admission: RE | Admit: 2018-03-10 | Discharge: 2018-03-10 | Disposition: A | Discharge status: Home / Self Care | Payer: Medicare Other | Source: Ambulatory Visit | Attending: Family Medicine | Admitting: Family Medicine

## 2018-03-10 DIAGNOSIS — Z1231 Encounter for screening mammogram for malignant neoplasm of breast: Secondary | ICD-10-CM | POA: Diagnosis not present

## 2018-03-11 DIAGNOSIS — J301 Allergic rhinitis due to pollen: Secondary | ICD-10-CM | POA: Diagnosis not present

## 2018-03-11 DIAGNOSIS — J3089 Other allergic rhinitis: Secondary | ICD-10-CM | POA: Diagnosis not present

## 2018-03-11 DIAGNOSIS — J3081 Allergic rhinitis due to animal (cat) (dog) hair and dander: Secondary | ICD-10-CM | POA: Diagnosis not present

## 2018-03-14 DIAGNOSIS — H401132 Primary open-angle glaucoma, bilateral, moderate stage: Secondary | ICD-10-CM | POA: Diagnosis not present

## 2018-03-14 DIAGNOSIS — H5203 Hypermetropia, bilateral: Secondary | ICD-10-CM | POA: Diagnosis not present

## 2018-03-18 DIAGNOSIS — J3081 Allergic rhinitis due to animal (cat) (dog) hair and dander: Secondary | ICD-10-CM | POA: Diagnosis not present

## 2018-03-18 DIAGNOSIS — J3089 Other allergic rhinitis: Secondary | ICD-10-CM | POA: Diagnosis not present

## 2018-03-18 DIAGNOSIS — J301 Allergic rhinitis due to pollen: Secondary | ICD-10-CM | POA: Diagnosis not present

## 2018-03-20 DIAGNOSIS — I10 Essential (primary) hypertension: Secondary | ICD-10-CM | POA: Diagnosis not present

## 2018-03-20 DIAGNOSIS — G250 Essential tremor: Secondary | ICD-10-CM | POA: Diagnosis not present

## 2018-03-25 DIAGNOSIS — J3081 Allergic rhinitis due to animal (cat) (dog) hair and dander: Secondary | ICD-10-CM | POA: Diagnosis not present

## 2018-03-25 DIAGNOSIS — J301 Allergic rhinitis due to pollen: Secondary | ICD-10-CM | POA: Diagnosis not present

## 2018-03-25 DIAGNOSIS — J3089 Other allergic rhinitis: Secondary | ICD-10-CM | POA: Diagnosis not present

## 2018-04-01 DIAGNOSIS — J301 Allergic rhinitis due to pollen: Secondary | ICD-10-CM | POA: Diagnosis not present

## 2018-04-01 DIAGNOSIS — J3081 Allergic rhinitis due to animal (cat) (dog) hair and dander: Secondary | ICD-10-CM | POA: Diagnosis not present

## 2018-04-01 DIAGNOSIS — J3089 Other allergic rhinitis: Secondary | ICD-10-CM | POA: Diagnosis not present

## 2018-04-08 DIAGNOSIS — J3089 Other allergic rhinitis: Secondary | ICD-10-CM | POA: Diagnosis not present

## 2018-04-08 DIAGNOSIS — J3081 Allergic rhinitis due to animal (cat) (dog) hair and dander: Secondary | ICD-10-CM | POA: Diagnosis not present

## 2018-04-08 DIAGNOSIS — J301 Allergic rhinitis due to pollen: Secondary | ICD-10-CM | POA: Diagnosis not present

## 2018-04-11 ENCOUNTER — Ambulatory Visit
Admission: RE | Admit: 2018-04-11 | Discharge: 2018-04-11 | Disposition: A | Discharge status: Home / Self Care | Payer: Medicare Other | Source: Ambulatory Visit | Attending: Family Medicine | Admitting: Family Medicine

## 2018-04-11 DIAGNOSIS — Z780 Asymptomatic menopausal state: Secondary | ICD-10-CM | POA: Diagnosis not present

## 2018-04-11 DIAGNOSIS — M810 Age-related osteoporosis without current pathological fracture: Secondary | ICD-10-CM

## 2018-04-11 DIAGNOSIS — M8589 Other specified disorders of bone density and structure, multiple sites: Secondary | ICD-10-CM | POA: Diagnosis not present

## 2018-04-15 DIAGNOSIS — J3089 Other allergic rhinitis: Secondary | ICD-10-CM | POA: Diagnosis not present

## 2018-04-15 DIAGNOSIS — J3081 Allergic rhinitis due to animal (cat) (dog) hair and dander: Secondary | ICD-10-CM | POA: Diagnosis not present

## 2018-04-15 DIAGNOSIS — J301 Allergic rhinitis due to pollen: Secondary | ICD-10-CM | POA: Diagnosis not present

## 2018-04-22 DIAGNOSIS — J301 Allergic rhinitis due to pollen: Secondary | ICD-10-CM | POA: Diagnosis not present

## 2018-04-22 DIAGNOSIS — J3089 Other allergic rhinitis: Secondary | ICD-10-CM | POA: Diagnosis not present

## 2018-04-22 DIAGNOSIS — J3081 Allergic rhinitis due to animal (cat) (dog) hair and dander: Secondary | ICD-10-CM | POA: Diagnosis not present

## 2018-04-29 DIAGNOSIS — J3081 Allergic rhinitis due to animal (cat) (dog) hair and dander: Secondary | ICD-10-CM | POA: Diagnosis not present

## 2018-04-29 DIAGNOSIS — J3089 Other allergic rhinitis: Secondary | ICD-10-CM | POA: Diagnosis not present

## 2018-04-29 DIAGNOSIS — J301 Allergic rhinitis due to pollen: Secondary | ICD-10-CM | POA: Diagnosis not present

## 2018-05-06 DIAGNOSIS — J3081 Allergic rhinitis due to animal (cat) (dog) hair and dander: Secondary | ICD-10-CM | POA: Diagnosis not present

## 2018-05-06 DIAGNOSIS — J301 Allergic rhinitis due to pollen: Secondary | ICD-10-CM | POA: Diagnosis not present

## 2018-05-06 DIAGNOSIS — J3089 Other allergic rhinitis: Secondary | ICD-10-CM | POA: Diagnosis not present

## 2018-05-08 DIAGNOSIS — Z658 Other specified problems related to psychosocial circumstances: Secondary | ICD-10-CM | POA: Diagnosis not present

## 2018-05-08 DIAGNOSIS — R05 Cough: Secondary | ICD-10-CM | POA: Diagnosis not present

## 2018-05-13 DIAGNOSIS — I2510 Atherosclerotic heart disease of native coronary artery without angina pectoris: Secondary | ICD-10-CM | POA: Diagnosis not present

## 2018-05-13 DIAGNOSIS — E782 Mixed hyperlipidemia: Secondary | ICD-10-CM | POA: Diagnosis not present

## 2018-05-13 DIAGNOSIS — I2584 Coronary atherosclerosis due to calcified coronary lesion: Secondary | ICD-10-CM | POA: Diagnosis not present

## 2018-05-13 DIAGNOSIS — I10 Essential (primary) hypertension: Secondary | ICD-10-CM | POA: Diagnosis not present

## 2018-05-14 DIAGNOSIS — J301 Allergic rhinitis due to pollen: Secondary | ICD-10-CM | POA: Diagnosis not present

## 2018-05-14 DIAGNOSIS — J3089 Other allergic rhinitis: Secondary | ICD-10-CM | POA: Diagnosis not present

## 2018-05-14 DIAGNOSIS — J3081 Allergic rhinitis due to animal (cat) (dog) hair and dander: Secondary | ICD-10-CM | POA: Diagnosis not present

## 2018-05-20 DIAGNOSIS — J301 Allergic rhinitis due to pollen: Secondary | ICD-10-CM | POA: Diagnosis not present

## 2018-05-20 DIAGNOSIS — J3089 Other allergic rhinitis: Secondary | ICD-10-CM | POA: Diagnosis not present

## 2018-05-20 DIAGNOSIS — J3081 Allergic rhinitis due to animal (cat) (dog) hair and dander: Secondary | ICD-10-CM | POA: Diagnosis not present

## 2018-05-27 DIAGNOSIS — J301 Allergic rhinitis due to pollen: Secondary | ICD-10-CM | POA: Diagnosis not present

## 2018-05-27 DIAGNOSIS — J3081 Allergic rhinitis due to animal (cat) (dog) hair and dander: Secondary | ICD-10-CM | POA: Diagnosis not present

## 2018-05-27 DIAGNOSIS — J3089 Other allergic rhinitis: Secondary | ICD-10-CM | POA: Diagnosis not present

## 2018-06-02 DIAGNOSIS — J329 Chronic sinusitis, unspecified: Secondary | ICD-10-CM | POA: Diagnosis not present

## 2018-06-02 DIAGNOSIS — H9209 Otalgia, unspecified ear: Secondary | ICD-10-CM | POA: Diagnosis not present

## 2018-06-03 DIAGNOSIS — F4322 Adjustment disorder with anxiety: Secondary | ICD-10-CM | POA: Diagnosis not present

## 2018-06-03 DIAGNOSIS — J3089 Other allergic rhinitis: Secondary | ICD-10-CM | POA: Diagnosis not present

## 2018-06-03 DIAGNOSIS — J301 Allergic rhinitis due to pollen: Secondary | ICD-10-CM | POA: Diagnosis not present

## 2018-06-03 DIAGNOSIS — J3081 Allergic rhinitis due to animal (cat) (dog) hair and dander: Secondary | ICD-10-CM | POA: Diagnosis not present

## 2018-06-06 DIAGNOSIS — D649 Anemia, unspecified: Secondary | ICD-10-CM | POA: Diagnosis not present

## 2018-06-06 DIAGNOSIS — F411 Generalized anxiety disorder: Secondary | ICD-10-CM | POA: Diagnosis not present

## 2018-06-06 DIAGNOSIS — I10 Essential (primary) hypertension: Secondary | ICD-10-CM | POA: Diagnosis not present

## 2018-06-10 DIAGNOSIS — J3081 Allergic rhinitis due to animal (cat) (dog) hair and dander: Secondary | ICD-10-CM | POA: Diagnosis not present

## 2018-06-10 DIAGNOSIS — J3089 Other allergic rhinitis: Secondary | ICD-10-CM | POA: Diagnosis not present

## 2018-06-10 DIAGNOSIS — J301 Allergic rhinitis due to pollen: Secondary | ICD-10-CM | POA: Diagnosis not present

## 2018-06-16 DIAGNOSIS — F4322 Adjustment disorder with anxiety: Secondary | ICD-10-CM | POA: Diagnosis not present

## 2018-06-17 DIAGNOSIS — J3089 Other allergic rhinitis: Secondary | ICD-10-CM | POA: Diagnosis not present

## 2018-06-17 DIAGNOSIS — J3081 Allergic rhinitis due to animal (cat) (dog) hair and dander: Secondary | ICD-10-CM | POA: Diagnosis not present

## 2018-06-17 DIAGNOSIS — J301 Allergic rhinitis due to pollen: Secondary | ICD-10-CM | POA: Diagnosis not present

## 2018-06-23 DIAGNOSIS — J329 Chronic sinusitis, unspecified: Secondary | ICD-10-CM | POA: Diagnosis not present

## 2018-06-23 DIAGNOSIS — J320 Chronic maxillary sinusitis: Secondary | ICD-10-CM | POA: Diagnosis not present

## 2018-06-24 DIAGNOSIS — J3089 Other allergic rhinitis: Secondary | ICD-10-CM | POA: Diagnosis not present

## 2018-06-24 DIAGNOSIS — J301 Allergic rhinitis due to pollen: Secondary | ICD-10-CM | POA: Diagnosis not present

## 2018-06-24 DIAGNOSIS — J3081 Allergic rhinitis due to animal (cat) (dog) hair and dander: Secondary | ICD-10-CM | POA: Diagnosis not present

## 2018-06-26 DIAGNOSIS — J3089 Other allergic rhinitis: Secondary | ICD-10-CM | POA: Diagnosis not present

## 2018-07-01 DIAGNOSIS — J301 Allergic rhinitis due to pollen: Secondary | ICD-10-CM | POA: Diagnosis not present

## 2018-07-01 DIAGNOSIS — J3081 Allergic rhinitis due to animal (cat) (dog) hair and dander: Secondary | ICD-10-CM | POA: Diagnosis not present

## 2018-07-01 DIAGNOSIS — J3089 Other allergic rhinitis: Secondary | ICD-10-CM | POA: Diagnosis not present

## 2018-07-08 DIAGNOSIS — J3089 Other allergic rhinitis: Secondary | ICD-10-CM | POA: Diagnosis not present

## 2018-07-08 DIAGNOSIS — J3081 Allergic rhinitis due to animal (cat) (dog) hair and dander: Secondary | ICD-10-CM | POA: Diagnosis not present

## 2018-07-08 DIAGNOSIS — J301 Allergic rhinitis due to pollen: Secondary | ICD-10-CM | POA: Diagnosis not present

## 2018-07-14 DIAGNOSIS — H401132 Primary open-angle glaucoma, bilateral, moderate stage: Secondary | ICD-10-CM | POA: Diagnosis not present

## 2018-07-14 DIAGNOSIS — H2513 Age-related nuclear cataract, bilateral: Secondary | ICD-10-CM | POA: Diagnosis not present

## 2018-07-15 DIAGNOSIS — J3081 Allergic rhinitis due to animal (cat) (dog) hair and dander: Secondary | ICD-10-CM | POA: Diagnosis not present

## 2018-07-15 DIAGNOSIS — J301 Allergic rhinitis due to pollen: Secondary | ICD-10-CM | POA: Diagnosis not present

## 2018-07-15 DIAGNOSIS — J3089 Other allergic rhinitis: Secondary | ICD-10-CM | POA: Diagnosis not present

## 2018-07-22 DIAGNOSIS — J3081 Allergic rhinitis due to animal (cat) (dog) hair and dander: Secondary | ICD-10-CM | POA: Diagnosis not present

## 2018-07-22 DIAGNOSIS — J3089 Other allergic rhinitis: Secondary | ICD-10-CM | POA: Diagnosis not present

## 2018-07-22 DIAGNOSIS — J301 Allergic rhinitis due to pollen: Secondary | ICD-10-CM | POA: Diagnosis not present

## 2018-07-29 DIAGNOSIS — J3089 Other allergic rhinitis: Secondary | ICD-10-CM | POA: Diagnosis not present

## 2018-07-29 DIAGNOSIS — J3081 Allergic rhinitis due to animal (cat) (dog) hair and dander: Secondary | ICD-10-CM | POA: Diagnosis not present

## 2018-07-29 DIAGNOSIS — J301 Allergic rhinitis due to pollen: Secondary | ICD-10-CM | POA: Diagnosis not present

## 2018-08-05 DIAGNOSIS — J3081 Allergic rhinitis due to animal (cat) (dog) hair and dander: Secondary | ICD-10-CM | POA: Diagnosis not present

## 2018-08-05 DIAGNOSIS — J3089 Other allergic rhinitis: Secondary | ICD-10-CM | POA: Diagnosis not present

## 2018-08-05 DIAGNOSIS — J301 Allergic rhinitis due to pollen: Secondary | ICD-10-CM | POA: Diagnosis not present

## 2018-08-13 DIAGNOSIS — J301 Allergic rhinitis due to pollen: Secondary | ICD-10-CM | POA: Diagnosis not present

## 2018-08-13 DIAGNOSIS — J3081 Allergic rhinitis due to animal (cat) (dog) hair and dander: Secondary | ICD-10-CM | POA: Diagnosis not present

## 2018-08-13 DIAGNOSIS — J3089 Other allergic rhinitis: Secondary | ICD-10-CM | POA: Diagnosis not present

## 2018-08-19 DIAGNOSIS — J3089 Other allergic rhinitis: Secondary | ICD-10-CM | POA: Diagnosis not present

## 2018-08-19 DIAGNOSIS — J301 Allergic rhinitis due to pollen: Secondary | ICD-10-CM | POA: Diagnosis not present

## 2018-08-19 DIAGNOSIS — J3081 Allergic rhinitis due to animal (cat) (dog) hair and dander: Secondary | ICD-10-CM | POA: Diagnosis not present

## 2018-08-26 DIAGNOSIS — J3089 Other allergic rhinitis: Secondary | ICD-10-CM | POA: Diagnosis not present

## 2018-08-26 DIAGNOSIS — J3081 Allergic rhinitis due to animal (cat) (dog) hair and dander: Secondary | ICD-10-CM | POA: Diagnosis not present

## 2018-08-26 DIAGNOSIS — J301 Allergic rhinitis due to pollen: Secondary | ICD-10-CM | POA: Diagnosis not present

## 2018-09-02 DIAGNOSIS — J3081 Allergic rhinitis due to animal (cat) (dog) hair and dander: Secondary | ICD-10-CM | POA: Diagnosis not present

## 2018-09-02 DIAGNOSIS — J3089 Other allergic rhinitis: Secondary | ICD-10-CM | POA: Diagnosis not present

## 2018-09-02 DIAGNOSIS — J301 Allergic rhinitis due to pollen: Secondary | ICD-10-CM | POA: Diagnosis not present

## 2018-09-09 DIAGNOSIS — J3089 Other allergic rhinitis: Secondary | ICD-10-CM | POA: Diagnosis not present

## 2018-09-09 DIAGNOSIS — J3081 Allergic rhinitis due to animal (cat) (dog) hair and dander: Secondary | ICD-10-CM | POA: Diagnosis not present

## 2018-09-09 DIAGNOSIS — J301 Allergic rhinitis due to pollen: Secondary | ICD-10-CM | POA: Diagnosis not present

## 2018-09-16 DIAGNOSIS — J301 Allergic rhinitis due to pollen: Secondary | ICD-10-CM | POA: Diagnosis not present

## 2018-09-16 DIAGNOSIS — J3089 Other allergic rhinitis: Secondary | ICD-10-CM | POA: Diagnosis not present

## 2018-09-16 DIAGNOSIS — J3081 Allergic rhinitis due to animal (cat) (dog) hair and dander: Secondary | ICD-10-CM | POA: Diagnosis not present

## 2018-09-23 DIAGNOSIS — J3089 Other allergic rhinitis: Secondary | ICD-10-CM | POA: Diagnosis not present

## 2018-09-23 DIAGNOSIS — J3081 Allergic rhinitis due to animal (cat) (dog) hair and dander: Secondary | ICD-10-CM | POA: Diagnosis not present

## 2018-09-23 DIAGNOSIS — J301 Allergic rhinitis due to pollen: Secondary | ICD-10-CM | POA: Diagnosis not present

## 2018-09-30 DIAGNOSIS — J3081 Allergic rhinitis due to animal (cat) (dog) hair and dander: Secondary | ICD-10-CM | POA: Diagnosis not present

## 2018-09-30 DIAGNOSIS — J3089 Other allergic rhinitis: Secondary | ICD-10-CM | POA: Diagnosis not present

## 2018-09-30 DIAGNOSIS — J301 Allergic rhinitis due to pollen: Secondary | ICD-10-CM | POA: Diagnosis not present

## 2018-10-02 DIAGNOSIS — R030 Elevated blood-pressure reading, without diagnosis of hypertension: Secondary | ICD-10-CM | POA: Diagnosis not present

## 2018-10-02 DIAGNOSIS — F430 Acute stress reaction: Secondary | ICD-10-CM | POA: Diagnosis not present

## 2018-10-07 DIAGNOSIS — J3089 Other allergic rhinitis: Secondary | ICD-10-CM | POA: Diagnosis not present

## 2018-10-07 DIAGNOSIS — J3081 Allergic rhinitis due to animal (cat) (dog) hair and dander: Secondary | ICD-10-CM | POA: Diagnosis not present

## 2018-10-07 DIAGNOSIS — J301 Allergic rhinitis due to pollen: Secondary | ICD-10-CM | POA: Diagnosis not present

## 2018-10-14 DIAGNOSIS — J301 Allergic rhinitis due to pollen: Secondary | ICD-10-CM | POA: Diagnosis not present

## 2018-10-14 DIAGNOSIS — J3089 Other allergic rhinitis: Secondary | ICD-10-CM | POA: Diagnosis not present

## 2018-10-14 DIAGNOSIS — J3081 Allergic rhinitis due to animal (cat) (dog) hair and dander: Secondary | ICD-10-CM | POA: Diagnosis not present

## 2018-10-21 DIAGNOSIS — J301 Allergic rhinitis due to pollen: Secondary | ICD-10-CM | POA: Diagnosis not present

## 2018-10-21 DIAGNOSIS — J3089 Other allergic rhinitis: Secondary | ICD-10-CM | POA: Diagnosis not present

## 2018-10-21 DIAGNOSIS — J3081 Allergic rhinitis due to animal (cat) (dog) hair and dander: Secondary | ICD-10-CM | POA: Diagnosis not present

## 2018-10-28 DIAGNOSIS — J301 Allergic rhinitis due to pollen: Secondary | ICD-10-CM | POA: Diagnosis not present

## 2018-10-28 DIAGNOSIS — J3089 Other allergic rhinitis: Secondary | ICD-10-CM | POA: Diagnosis not present

## 2018-10-28 DIAGNOSIS — J3081 Allergic rhinitis due to animal (cat) (dog) hair and dander: Secondary | ICD-10-CM | POA: Diagnosis not present

## 2018-11-04 DIAGNOSIS — J3081 Allergic rhinitis due to animal (cat) (dog) hair and dander: Secondary | ICD-10-CM | POA: Diagnosis not present

## 2018-11-04 DIAGNOSIS — J3089 Other allergic rhinitis: Secondary | ICD-10-CM | POA: Diagnosis not present

## 2018-11-04 DIAGNOSIS — J301 Allergic rhinitis due to pollen: Secondary | ICD-10-CM | POA: Diagnosis not present

## 2018-11-11 DIAGNOSIS — J3089 Other allergic rhinitis: Secondary | ICD-10-CM | POA: Diagnosis not present

## 2018-11-11 DIAGNOSIS — J301 Allergic rhinitis due to pollen: Secondary | ICD-10-CM | POA: Diagnosis not present

## 2018-11-11 DIAGNOSIS — J3081 Allergic rhinitis due to animal (cat) (dog) hair and dander: Secondary | ICD-10-CM | POA: Diagnosis not present

## 2018-11-13 DIAGNOSIS — H401132 Primary open-angle glaucoma, bilateral, moderate stage: Secondary | ICD-10-CM | POA: Diagnosis not present

## 2018-11-18 DIAGNOSIS — J301 Allergic rhinitis due to pollen: Secondary | ICD-10-CM | POA: Diagnosis not present

## 2018-11-18 DIAGNOSIS — J3081 Allergic rhinitis due to animal (cat) (dog) hair and dander: Secondary | ICD-10-CM | POA: Diagnosis not present

## 2018-11-18 DIAGNOSIS — J3089 Other allergic rhinitis: Secondary | ICD-10-CM | POA: Diagnosis not present

## 2018-11-24 DIAGNOSIS — G5602 Carpal tunnel syndrome, left upper limb: Secondary | ICD-10-CM | POA: Diagnosis not present

## 2018-11-24 DIAGNOSIS — M25532 Pain in left wrist: Secondary | ICD-10-CM | POA: Diagnosis not present

## 2018-11-24 DIAGNOSIS — M67442 Ganglion, left hand: Secondary | ICD-10-CM | POA: Diagnosis not present

## 2018-11-25 DIAGNOSIS — J3081 Allergic rhinitis due to animal (cat) (dog) hair and dander: Secondary | ICD-10-CM | POA: Diagnosis not present

## 2018-11-25 DIAGNOSIS — J301 Allergic rhinitis due to pollen: Secondary | ICD-10-CM | POA: Diagnosis not present

## 2018-11-25 DIAGNOSIS — J3089 Other allergic rhinitis: Secondary | ICD-10-CM | POA: Diagnosis not present

## 2018-12-02 DIAGNOSIS — J3089 Other allergic rhinitis: Secondary | ICD-10-CM | POA: Diagnosis not present

## 2018-12-02 DIAGNOSIS — J3081 Allergic rhinitis due to animal (cat) (dog) hair and dander: Secondary | ICD-10-CM | POA: Diagnosis not present

## 2018-12-02 DIAGNOSIS — J301 Allergic rhinitis due to pollen: Secondary | ICD-10-CM | POA: Diagnosis not present

## 2018-12-09 DIAGNOSIS — J3081 Allergic rhinitis due to animal (cat) (dog) hair and dander: Secondary | ICD-10-CM | POA: Diagnosis not present

## 2018-12-09 DIAGNOSIS — J301 Allergic rhinitis due to pollen: Secondary | ICD-10-CM | POA: Diagnosis not present

## 2018-12-09 DIAGNOSIS — J3089 Other allergic rhinitis: Secondary | ICD-10-CM | POA: Diagnosis not present

## 2018-12-16 DIAGNOSIS — J3081 Allergic rhinitis due to animal (cat) (dog) hair and dander: Secondary | ICD-10-CM | POA: Diagnosis not present

## 2018-12-16 DIAGNOSIS — J301 Allergic rhinitis due to pollen: Secondary | ICD-10-CM | POA: Diagnosis not present

## 2018-12-16 DIAGNOSIS — J3089 Other allergic rhinitis: Secondary | ICD-10-CM | POA: Diagnosis not present

## 2018-12-23 DIAGNOSIS — J3089 Other allergic rhinitis: Secondary | ICD-10-CM | POA: Diagnosis not present

## 2018-12-23 DIAGNOSIS — J301 Allergic rhinitis due to pollen: Secondary | ICD-10-CM | POA: Diagnosis not present

## 2018-12-23 DIAGNOSIS — J3081 Allergic rhinitis due to animal (cat) (dog) hair and dander: Secondary | ICD-10-CM | POA: Diagnosis not present

## 2018-12-29 DIAGNOSIS — M67432 Ganglion, left wrist: Secondary | ICD-10-CM | POA: Diagnosis not present

## 2018-12-29 DIAGNOSIS — M25532 Pain in left wrist: Secondary | ICD-10-CM | POA: Diagnosis not present

## 2018-12-29 DIAGNOSIS — M13839 Other specified arthritis, unspecified wrist: Secondary | ICD-10-CM | POA: Diagnosis not present

## 2018-12-29 DIAGNOSIS — G5602 Carpal tunnel syndrome, left upper limb: Secondary | ICD-10-CM | POA: Diagnosis not present

## 2018-12-30 DIAGNOSIS — J3089 Other allergic rhinitis: Secondary | ICD-10-CM | POA: Diagnosis not present

## 2018-12-30 DIAGNOSIS — J3081 Allergic rhinitis due to animal (cat) (dog) hair and dander: Secondary | ICD-10-CM | POA: Diagnosis not present

## 2018-12-30 DIAGNOSIS — J301 Allergic rhinitis due to pollen: Secondary | ICD-10-CM | POA: Diagnosis not present

## 2019-01-05 DIAGNOSIS — L814 Other melanin hyperpigmentation: Secondary | ICD-10-CM | POA: Diagnosis not present

## 2019-01-05 DIAGNOSIS — I788 Other diseases of capillaries: Secondary | ICD-10-CM | POA: Diagnosis not present

## 2019-01-05 DIAGNOSIS — L304 Erythema intertrigo: Secondary | ICD-10-CM | POA: Diagnosis not present

## 2019-01-05 DIAGNOSIS — L821 Other seborrheic keratosis: Secondary | ICD-10-CM | POA: Diagnosis not present

## 2019-01-05 DIAGNOSIS — B078 Other viral warts: Secondary | ICD-10-CM | POA: Diagnosis not present

## 2019-01-05 DIAGNOSIS — D225 Melanocytic nevi of trunk: Secondary | ICD-10-CM | POA: Diagnosis not present

## 2019-01-06 DIAGNOSIS — J3089 Other allergic rhinitis: Secondary | ICD-10-CM | POA: Diagnosis not present

## 2019-01-06 DIAGNOSIS — J301 Allergic rhinitis due to pollen: Secondary | ICD-10-CM | POA: Diagnosis not present

## 2019-01-06 DIAGNOSIS — J3081 Allergic rhinitis due to animal (cat) (dog) hair and dander: Secondary | ICD-10-CM | POA: Diagnosis not present

## 2019-01-08 DIAGNOSIS — E559 Vitamin D deficiency, unspecified: Secondary | ICD-10-CM | POA: Diagnosis not present

## 2019-01-08 DIAGNOSIS — Z0000 Encounter for general adult medical examination without abnormal findings: Secondary | ICD-10-CM | POA: Diagnosis not present

## 2019-01-08 DIAGNOSIS — F411 Generalized anxiety disorder: Secondary | ICD-10-CM | POA: Diagnosis not present

## 2019-01-08 DIAGNOSIS — I10 Essential (primary) hypertension: Secondary | ICD-10-CM | POA: Diagnosis not present

## 2019-01-08 DIAGNOSIS — G479 Sleep disorder, unspecified: Secondary | ICD-10-CM | POA: Diagnosis not present

## 2019-01-08 DIAGNOSIS — Z23 Encounter for immunization: Secondary | ICD-10-CM | POA: Diagnosis not present

## 2019-01-08 DIAGNOSIS — G250 Essential tremor: Secondary | ICD-10-CM | POA: Diagnosis not present

## 2019-01-13 DIAGNOSIS — J301 Allergic rhinitis due to pollen: Secondary | ICD-10-CM | POA: Diagnosis not present

## 2019-01-13 DIAGNOSIS — J3081 Allergic rhinitis due to animal (cat) (dog) hair and dander: Secondary | ICD-10-CM | POA: Diagnosis not present

## 2019-01-13 DIAGNOSIS — J3089 Other allergic rhinitis: Secondary | ICD-10-CM | POA: Diagnosis not present

## 2019-01-20 DIAGNOSIS — J3089 Other allergic rhinitis: Secondary | ICD-10-CM | POA: Diagnosis not present

## 2019-01-20 DIAGNOSIS — J3081 Allergic rhinitis due to animal (cat) (dog) hair and dander: Secondary | ICD-10-CM | POA: Diagnosis not present

## 2019-01-20 DIAGNOSIS — J301 Allergic rhinitis due to pollen: Secondary | ICD-10-CM | POA: Diagnosis not present

## 2019-01-21 ENCOUNTER — Ambulatory Visit: Payer: Medicare Other | Admitting: Neurology

## 2019-01-26 DIAGNOSIS — M67432 Ganglion, left wrist: Secondary | ICD-10-CM | POA: Diagnosis not present

## 2019-01-26 DIAGNOSIS — G5602 Carpal tunnel syndrome, left upper limb: Secondary | ICD-10-CM | POA: Diagnosis not present

## 2019-01-26 DIAGNOSIS — M25532 Pain in left wrist: Secondary | ICD-10-CM | POA: Diagnosis not present

## 2019-01-26 DIAGNOSIS — M13839 Other specified arthritis, unspecified wrist: Secondary | ICD-10-CM | POA: Diagnosis not present

## 2019-01-27 DIAGNOSIS — J3089 Other allergic rhinitis: Secondary | ICD-10-CM | POA: Diagnosis not present

## 2019-01-27 DIAGNOSIS — J301 Allergic rhinitis due to pollen: Secondary | ICD-10-CM | POA: Diagnosis not present

## 2019-01-27 DIAGNOSIS — J3081 Allergic rhinitis due to animal (cat) (dog) hair and dander: Secondary | ICD-10-CM | POA: Diagnosis not present

## 2019-02-03 ENCOUNTER — Other Ambulatory Visit: Payer: Self-pay | Admitting: Family Medicine

## 2019-02-03 DIAGNOSIS — J3089 Other allergic rhinitis: Secondary | ICD-10-CM | POA: Diagnosis not present

## 2019-02-03 DIAGNOSIS — Z1231 Encounter for screening mammogram for malignant neoplasm of breast: Secondary | ICD-10-CM

## 2019-02-03 DIAGNOSIS — J3081 Allergic rhinitis due to animal (cat) (dog) hair and dander: Secondary | ICD-10-CM | POA: Diagnosis not present

## 2019-02-03 DIAGNOSIS — J301 Allergic rhinitis due to pollen: Secondary | ICD-10-CM | POA: Diagnosis not present

## 2019-02-05 DIAGNOSIS — T63443D Toxic effect of venom of bees, assault, subsequent encounter: Secondary | ICD-10-CM | POA: Diagnosis not present

## 2019-02-05 DIAGNOSIS — J301 Allergic rhinitis due to pollen: Secondary | ICD-10-CM | POA: Diagnosis not present

## 2019-02-05 DIAGNOSIS — J3089 Other allergic rhinitis: Secondary | ICD-10-CM | POA: Diagnosis not present

## 2019-02-05 DIAGNOSIS — J3081 Allergic rhinitis due to animal (cat) (dog) hair and dander: Secondary | ICD-10-CM | POA: Diagnosis not present

## 2019-02-10 DIAGNOSIS — J301 Allergic rhinitis due to pollen: Secondary | ICD-10-CM | POA: Diagnosis not present

## 2019-02-10 DIAGNOSIS — J3089 Other allergic rhinitis: Secondary | ICD-10-CM | POA: Diagnosis not present

## 2019-02-10 DIAGNOSIS — J3081 Allergic rhinitis due to animal (cat) (dog) hair and dander: Secondary | ICD-10-CM | POA: Diagnosis not present

## 2019-02-17 DIAGNOSIS — J3089 Other allergic rhinitis: Secondary | ICD-10-CM | POA: Diagnosis not present

## 2019-02-17 DIAGNOSIS — J3081 Allergic rhinitis due to animal (cat) (dog) hair and dander: Secondary | ICD-10-CM | POA: Diagnosis not present

## 2019-02-17 DIAGNOSIS — J301 Allergic rhinitis due to pollen: Secondary | ICD-10-CM | POA: Diagnosis not present

## 2019-02-24 DIAGNOSIS — J3089 Other allergic rhinitis: Secondary | ICD-10-CM | POA: Diagnosis not present

## 2019-02-24 DIAGNOSIS — J301 Allergic rhinitis due to pollen: Secondary | ICD-10-CM | POA: Diagnosis not present

## 2019-02-24 DIAGNOSIS — J3081 Allergic rhinitis due to animal (cat) (dog) hair and dander: Secondary | ICD-10-CM | POA: Diagnosis not present

## 2019-03-03 DIAGNOSIS — J3089 Other allergic rhinitis: Secondary | ICD-10-CM | POA: Diagnosis not present

## 2019-03-03 DIAGNOSIS — J301 Allergic rhinitis due to pollen: Secondary | ICD-10-CM | POA: Diagnosis not present

## 2019-03-03 DIAGNOSIS — J3081 Allergic rhinitis due to animal (cat) (dog) hair and dander: Secondary | ICD-10-CM | POA: Diagnosis not present

## 2019-03-10 DIAGNOSIS — J3089 Other allergic rhinitis: Secondary | ICD-10-CM | POA: Diagnosis not present

## 2019-03-10 DIAGNOSIS — J301 Allergic rhinitis due to pollen: Secondary | ICD-10-CM | POA: Diagnosis not present

## 2019-03-10 DIAGNOSIS — J3081 Allergic rhinitis due to animal (cat) (dog) hair and dander: Secondary | ICD-10-CM | POA: Diagnosis not present

## 2019-03-14 DIAGNOSIS — R05 Cough: Secondary | ICD-10-CM | POA: Diagnosis not present

## 2019-03-25 ENCOUNTER — Ambulatory Visit: Payer: Medicare Other

## 2019-04-21 DIAGNOSIS — J3089 Other allergic rhinitis: Secondary | ICD-10-CM | POA: Diagnosis not present

## 2019-04-21 DIAGNOSIS — J301 Allergic rhinitis due to pollen: Secondary | ICD-10-CM | POA: Diagnosis not present

## 2019-04-21 DIAGNOSIS — J3081 Allergic rhinitis due to animal (cat) (dog) hair and dander: Secondary | ICD-10-CM | POA: Diagnosis not present

## 2019-04-28 DIAGNOSIS — J301 Allergic rhinitis due to pollen: Secondary | ICD-10-CM | POA: Diagnosis not present

## 2019-04-28 DIAGNOSIS — N390 Urinary tract infection, site not specified: Secondary | ICD-10-CM | POA: Diagnosis not present

## 2019-04-28 DIAGNOSIS — J3089 Other allergic rhinitis: Secondary | ICD-10-CM | POA: Diagnosis not present

## 2019-04-28 DIAGNOSIS — J3081 Allergic rhinitis due to animal (cat) (dog) hair and dander: Secondary | ICD-10-CM | POA: Diagnosis not present

## 2019-04-28 DIAGNOSIS — R251 Tremor, unspecified: Secondary | ICD-10-CM | POA: Diagnosis not present

## 2019-04-29 ENCOUNTER — Other Ambulatory Visit: Payer: Self-pay

## 2019-04-29 ENCOUNTER — Ambulatory Visit
Admission: RE | Admit: 2019-04-29 | Discharge: 2019-04-29 | Disposition: A | Discharge status: Home / Self Care | Payer: Medicare Other | Source: Ambulatory Visit | Attending: Family Medicine | Admitting: Family Medicine

## 2019-04-29 DIAGNOSIS — Z1231 Encounter for screening mammogram for malignant neoplasm of breast: Secondary | ICD-10-CM

## 2019-05-04 DIAGNOSIS — Z711 Person with feared health complaint in whom no diagnosis is made: Secondary | ICD-10-CM | POA: Diagnosis not present

## 2019-05-05 DIAGNOSIS — J3081 Allergic rhinitis due to animal (cat) (dog) hair and dander: Secondary | ICD-10-CM | POA: Diagnosis not present

## 2019-05-05 DIAGNOSIS — J301 Allergic rhinitis due to pollen: Secondary | ICD-10-CM | POA: Diagnosis not present

## 2019-05-05 DIAGNOSIS — J3089 Other allergic rhinitis: Secondary | ICD-10-CM | POA: Diagnosis not present

## 2019-05-12 DIAGNOSIS — J3089 Other allergic rhinitis: Secondary | ICD-10-CM | POA: Diagnosis not present

## 2019-05-12 DIAGNOSIS — J301 Allergic rhinitis due to pollen: Secondary | ICD-10-CM | POA: Diagnosis not present

## 2019-05-12 DIAGNOSIS — J3081 Allergic rhinitis due to animal (cat) (dog) hair and dander: Secondary | ICD-10-CM | POA: Diagnosis not present

## 2019-05-18 DIAGNOSIS — H401132 Primary open-angle glaucoma, bilateral, moderate stage: Secondary | ICD-10-CM | POA: Diagnosis not present

## 2019-05-21 DIAGNOSIS — J3081 Allergic rhinitis due to animal (cat) (dog) hair and dander: Secondary | ICD-10-CM | POA: Diagnosis not present

## 2019-05-21 DIAGNOSIS — J3089 Other allergic rhinitis: Secondary | ICD-10-CM | POA: Diagnosis not present

## 2019-05-21 DIAGNOSIS — J301 Allergic rhinitis due to pollen: Secondary | ICD-10-CM | POA: Diagnosis not present

## 2019-05-26 DIAGNOSIS — J3089 Other allergic rhinitis: Secondary | ICD-10-CM | POA: Diagnosis not present

## 2019-05-26 DIAGNOSIS — J301 Allergic rhinitis due to pollen: Secondary | ICD-10-CM | POA: Diagnosis not present

## 2019-05-26 DIAGNOSIS — J3081 Allergic rhinitis due to animal (cat) (dog) hair and dander: Secondary | ICD-10-CM | POA: Diagnosis not present

## 2019-06-02 DIAGNOSIS — J3089 Other allergic rhinitis: Secondary | ICD-10-CM | POA: Diagnosis not present

## 2019-06-02 DIAGNOSIS — J3081 Allergic rhinitis due to animal (cat) (dog) hair and dander: Secondary | ICD-10-CM | POA: Diagnosis not present

## 2019-06-02 DIAGNOSIS — J301 Allergic rhinitis due to pollen: Secondary | ICD-10-CM | POA: Diagnosis not present

## 2019-06-09 DIAGNOSIS — J301 Allergic rhinitis due to pollen: Secondary | ICD-10-CM | POA: Diagnosis not present

## 2019-06-09 DIAGNOSIS — J3089 Other allergic rhinitis: Secondary | ICD-10-CM | POA: Diagnosis not present

## 2019-06-09 DIAGNOSIS — J3081 Allergic rhinitis due to animal (cat) (dog) hair and dander: Secondary | ICD-10-CM | POA: Diagnosis not present

## 2019-06-10 DIAGNOSIS — M67432 Ganglion, left wrist: Secondary | ICD-10-CM | POA: Diagnosis not present

## 2019-06-10 DIAGNOSIS — G5602 Carpal tunnel syndrome, left upper limb: Secondary | ICD-10-CM | POA: Diagnosis not present

## 2019-06-10 DIAGNOSIS — M13832 Other specified arthritis, left wrist: Secondary | ICD-10-CM | POA: Diagnosis not present

## 2019-06-10 DIAGNOSIS — M67442 Ganglion, left hand: Secondary | ICD-10-CM | POA: Diagnosis not present

## 2019-06-10 DIAGNOSIS — M25532 Pain in left wrist: Secondary | ICD-10-CM | POA: Diagnosis not present
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
## 2019-06-12 ENCOUNTER — Encounter: Payer: Self-pay | Admitting: Neurology

## 2019-06-12 ENCOUNTER — Other Ambulatory Visit: Payer: Self-pay

## 2019-06-12 ENCOUNTER — Ambulatory Visit (INDEPENDENT_AMBULATORY_CARE_PROVIDER_SITE_OTHER): Payer: Medicare Other | Admitting: Neurology

## 2019-06-12 VITALS — BP 186/80 | HR 61 | Ht 59.0 in | Wt 179.0 lb

## 2019-06-12 DIAGNOSIS — F29 Unspecified psychosis not due to a substance or known physiological condition: Secondary | ICD-10-CM

## 2019-06-12 DIAGNOSIS — I10 Essential (primary) hypertension: Secondary | ICD-10-CM | POA: Diagnosis not present

## 2019-06-12 DIAGNOSIS — G250 Essential tremor: Secondary | ICD-10-CM | POA: Diagnosis not present

## 2019-06-12 NOTE — Patient Instructions (Addendum)
We discussed surgical interventions like DBS and focused ultrasound.  I know that these don't interest you.  We discussed 2nd line medications.  We discussed that many of the second line medications are second line drugs either because of side effect profile or because of lack of effectiveness.  I would recommend starting with gabapentin for a 2nd line medication due to less side effects.  Some of the others such as artane and cogentin may work well/better but have more side effects like falls, confusion and dry mouth/dry eyes.

## 2019-06-16 DIAGNOSIS — J301 Allergic rhinitis due to pollen: Secondary | ICD-10-CM | POA: Diagnosis not present

## 2019-06-16 DIAGNOSIS — J3081 Allergic rhinitis due to animal (cat) (dog) hair and dander: Secondary | ICD-10-CM | POA: Diagnosis not present

## 2019-06-16 DIAGNOSIS — J3089 Other allergic rhinitis: Secondary | ICD-10-CM | POA: Diagnosis not present

## 2019-06-17 DIAGNOSIS — I2584 Coronary atherosclerosis due to calcified coronary lesion: Secondary | ICD-10-CM | POA: Diagnosis not present

## 2019-06-17 DIAGNOSIS — I2510 Atherosclerotic heart disease of native coronary artery without angina pectoris: Secondary | ICD-10-CM | POA: Diagnosis not present

## 2019-06-17 DIAGNOSIS — E782 Mixed hyperlipidemia: Secondary | ICD-10-CM | POA: Diagnosis not present

## 2019-06-17 DIAGNOSIS — I10 Essential (primary) hypertension: Secondary | ICD-10-CM | POA: Diagnosis not present

## 2019-06-18 ENCOUNTER — Telehealth: Payer: Self-pay | Admitting: Neurology

## 2019-06-18 NOTE — Telephone Encounter (Signed)
Patient called in regarding her Gabapentin medication. She is needing a 30 day supply and if it works se will go with a 90 day supply. She said this has been discussed with Dr. Carles Collet. She uses [facility] [address].  Please Call.

## 2019-06-18 NOTE — Telephone Encounter (Signed)
Tee, please call PCP and see if she has seen him since she has seen me (see last note).

## 2019-06-19 DIAGNOSIS — M13832 Other specified arthritis, left wrist: Secondary | ICD-10-CM | POA: Diagnosis not present

## 2019-06-19 NOTE — Telephone Encounter (Signed)
Per [facility] patient had a televisit 01/25 and then one on 4/8 in office.

## 2019-06-19 NOTE — Telephone Encounter (Signed)
Per Dr Harrington Challenger note this is a chronic issue for patient and he is ok with her waiting to be seen in April.

## 2019-06-19 NOTE — Telephone Encounter (Signed)
Spoke with Education officer, museum about this.  I'm concerned about her.  LCSW will reach out to her.

## 2019-06-19 NOTE — Telephone Encounter (Signed)
I personally called the [facility] office last week and talked with the physician and they assured me that they would call her and make an appointment for last monday(see my office note).  This patient has active psychosis and I am concerned about her.  What happened with that?

## 2019-06-19 NOTE — Telephone Encounter (Signed)
Patient called to check on the status of the call. She expressed frustration that this has not been sent in yet.

## 2019-06-23 DIAGNOSIS — J3081 Allergic rhinitis due to animal (cat) (dog) hair and dander: Secondary | ICD-10-CM | POA: Diagnosis not present

## 2019-06-23 DIAGNOSIS — R109 Unspecified abdominal pain: Secondary | ICD-10-CM | POA: Diagnosis not present

## 2019-06-23 DIAGNOSIS — Z658 Other specified problems related to psychosocial circumstances: Secondary | ICD-10-CM | POA: Diagnosis not present

## 2019-06-23 DIAGNOSIS — J3089 Other allergic rhinitis: Secondary | ICD-10-CM | POA: Diagnosis not present

## 2019-06-23 DIAGNOSIS — I10 Essential (primary) hypertension: Secondary | ICD-10-CM | POA: Diagnosis not present

## 2019-06-23 DIAGNOSIS — J301 Allergic rhinitis due to pollen: Secondary | ICD-10-CM | POA: Diagnosis not present

## 2019-06-23 DIAGNOSIS — R251 Tremor, unspecified: Secondary | ICD-10-CM | POA: Diagnosis not present

## 2019-06-23 DIAGNOSIS — F430 Acute stress reaction: Secondary | ICD-10-CM | POA: Diagnosis not present

## 2019-06-26 NOTE — Telephone Encounter (Signed)
I spoke with pt and offered her to come in and discuss care coordination but pt declined. Encouraged her to contact me re care needs.

## 2019-06-29 DIAGNOSIS — G5602 Carpal tunnel syndrome, left upper limb: Secondary | ICD-10-CM | POA: Diagnosis not present

## 2019-06-30 DIAGNOSIS — J3081 Allergic rhinitis due to animal (cat) (dog) hair and dander: Secondary | ICD-10-CM | POA: Diagnosis not present

## 2019-06-30 DIAGNOSIS — J301 Allergic rhinitis due to pollen: Secondary | ICD-10-CM | POA: Diagnosis not present

## 2019-06-30 DIAGNOSIS — J3089 Other allergic rhinitis: Secondary | ICD-10-CM | POA: Diagnosis not present

## 2019-07-02 DIAGNOSIS — M25832 Other specified joint disorders, left wrist: Secondary | ICD-10-CM | POA: Diagnosis not present

## 2019-07-02 DIAGNOSIS — M13832 Other specified arthritis, left wrist: Secondary | ICD-10-CM | POA: Diagnosis not present

## 2019-07-02 DIAGNOSIS — M25532 Pain in left wrist: Secondary | ICD-10-CM | POA: Diagnosis not present

## 2019-07-02 DIAGNOSIS — M13842 Other specified arthritis, left hand: Secondary | ICD-10-CM | POA: Diagnosis not present

## 2019-07-02 DIAGNOSIS — G5602 Carpal tunnel syndrome, left upper limb: Secondary | ICD-10-CM | POA: Diagnosis not present

## 2019-07-07 DIAGNOSIS — J3089 Other allergic rhinitis: Secondary | ICD-10-CM | POA: Diagnosis not present

## 2019-07-07 DIAGNOSIS — G250 Essential tremor: Secondary | ICD-10-CM | POA: Diagnosis not present

## 2019-07-07 DIAGNOSIS — J3081 Allergic rhinitis due to animal (cat) (dog) hair and dander: Secondary | ICD-10-CM | POA: Diagnosis not present

## 2019-07-07 DIAGNOSIS — J301 Allergic rhinitis due to pollen: Secondary | ICD-10-CM | POA: Diagnosis not present

## 2019-07-08 DIAGNOSIS — T300 Burn of unspecified body region, unspecified degree: Secondary | ICD-10-CM | POA: Diagnosis not present

## 2019-07-08 DIAGNOSIS — R000 Tachycardia, unspecified: Secondary | ICD-10-CM | POA: Diagnosis not present

## 2019-07-08 DIAGNOSIS — R4020 Unspecified coma: Secondary | ICD-10-CM | POA: Diagnosis not present

## 2019-07-08 DIAGNOSIS — R42 Dizziness and giddiness: Secondary | ICD-10-CM | POA: Diagnosis not present

## 2019-07-08 DIAGNOSIS — R442 Other hallucinations: Secondary | ICD-10-CM | POA: Diagnosis not present

## 2019-07-09 DIAGNOSIS — J3081 Allergic rhinitis due to animal (cat) (dog) hair and dander: Secondary | ICD-10-CM | POA: Diagnosis not present

## 2019-07-09 DIAGNOSIS — J301 Allergic rhinitis due to pollen: Secondary | ICD-10-CM | POA: Diagnosis not present

## 2019-07-14 DIAGNOSIS — J301 Allergic rhinitis due to pollen: Secondary | ICD-10-CM | POA: Diagnosis not present

## 2019-07-14 DIAGNOSIS — J3081 Allergic rhinitis due to animal (cat) (dog) hair and dander: Secondary | ICD-10-CM | POA: Diagnosis not present

## 2019-07-14 DIAGNOSIS — J3089 Other allergic rhinitis: Secondary | ICD-10-CM | POA: Diagnosis not present

## 2019-07-21 DIAGNOSIS — J3081 Allergic rhinitis due to animal (cat) (dog) hair and dander: Secondary | ICD-10-CM | POA: Diagnosis not present

## 2019-07-21 DIAGNOSIS — J301 Allergic rhinitis due to pollen: Secondary | ICD-10-CM | POA: Diagnosis not present

## 2019-07-21 DIAGNOSIS — J3089 Other allergic rhinitis: Secondary | ICD-10-CM | POA: Diagnosis not present

## 2019-07-28 DIAGNOSIS — J301 Allergic rhinitis due to pollen: Secondary | ICD-10-CM | POA: Diagnosis not present

## 2019-07-28 DIAGNOSIS — J3081 Allergic rhinitis due to animal (cat) (dog) hair and dander: Secondary | ICD-10-CM | POA: Diagnosis not present

## 2019-07-28 DIAGNOSIS — J3089 Other allergic rhinitis: Secondary | ICD-10-CM | POA: Diagnosis not present

## 2019-08-04 DIAGNOSIS — J3081 Allergic rhinitis due to animal (cat) (dog) hair and dander: Secondary | ICD-10-CM | POA: Diagnosis not present

## 2019-08-04 DIAGNOSIS — J301 Allergic rhinitis due to pollen: Secondary | ICD-10-CM | POA: Diagnosis not present

## 2019-08-04 DIAGNOSIS — J3089 Other allergic rhinitis: Secondary | ICD-10-CM | POA: Diagnosis not present

## 2019-08-06 DIAGNOSIS — I10 Essential (primary) hypertension: Secondary | ICD-10-CM | POA: Diagnosis not present

## 2019-08-06 DIAGNOSIS — F411 Generalized anxiety disorder: Secondary | ICD-10-CM | POA: Diagnosis not present

## 2019-08-11 DIAGNOSIS — J3081 Allergic rhinitis due to animal (cat) (dog) hair and dander: Secondary | ICD-10-CM | POA: Diagnosis not present

## 2019-08-11 DIAGNOSIS — J3089 Other allergic rhinitis: Secondary | ICD-10-CM | POA: Diagnosis not present

## 2019-08-11 DIAGNOSIS — J301 Allergic rhinitis due to pollen: Secondary | ICD-10-CM | POA: Diagnosis not present

## 2019-08-18 DIAGNOSIS — J301 Allergic rhinitis due to pollen: Secondary | ICD-10-CM | POA: Diagnosis not present

## 2019-08-18 DIAGNOSIS — J3089 Other allergic rhinitis: Secondary | ICD-10-CM | POA: Diagnosis not present

## 2019-08-18 DIAGNOSIS — J3081 Allergic rhinitis due to animal (cat) (dog) hair and dander: Secondary | ICD-10-CM | POA: Diagnosis not present

## 2019-08-25 DIAGNOSIS — J3089 Other allergic rhinitis: Secondary | ICD-10-CM | POA: Diagnosis not present

## 2019-08-25 DIAGNOSIS — J301 Allergic rhinitis due to pollen: Secondary | ICD-10-CM | POA: Diagnosis not present

## 2019-08-25 DIAGNOSIS — J3081 Allergic rhinitis due to animal (cat) (dog) hair and dander: Secondary | ICD-10-CM | POA: Diagnosis not present

## 2019-09-01 DIAGNOSIS — J301 Allergic rhinitis due to pollen: Secondary | ICD-10-CM | POA: Diagnosis not present

## 2019-09-01 DIAGNOSIS — J3081 Allergic rhinitis due to animal (cat) (dog) hair and dander: Secondary | ICD-10-CM | POA: Diagnosis not present

## 2019-09-01 DIAGNOSIS — J3089 Other allergic rhinitis: Secondary | ICD-10-CM | POA: Diagnosis not present

## 2019-09-01 DIAGNOSIS — S61211A Laceration without foreign body of left index finger without damage to nail, initial encounter: Secondary | ICD-10-CM | POA: Diagnosis not present

## 2019-09-08 DIAGNOSIS — J3081 Allergic rhinitis due to animal (cat) (dog) hair and dander: Secondary | ICD-10-CM | POA: Diagnosis not present

## 2019-09-08 DIAGNOSIS — J3089 Other allergic rhinitis: Secondary | ICD-10-CM | POA: Diagnosis not present

## 2019-09-08 DIAGNOSIS — J301 Allergic rhinitis due to pollen: Secondary | ICD-10-CM | POA: Diagnosis not present

## 2019-09-15 DIAGNOSIS — J3081 Allergic rhinitis due to animal (cat) (dog) hair and dander: Secondary | ICD-10-CM | POA: Diagnosis not present

## 2019-09-15 DIAGNOSIS — J3089 Other allergic rhinitis: Secondary | ICD-10-CM | POA: Diagnosis not present

## 2019-09-15 DIAGNOSIS — J301 Allergic rhinitis due to pollen: Secondary | ICD-10-CM | POA: Diagnosis not present

## 2019-10-20 DIAGNOSIS — J301 Allergic rhinitis due to pollen: Secondary | ICD-10-CM | POA: Diagnosis not present

## 2019-10-20 DIAGNOSIS — J3081 Allergic rhinitis due to animal (cat) (dog) hair and dander: Secondary | ICD-10-CM | POA: Diagnosis not present

## 2019-10-20 DIAGNOSIS — J3089 Other allergic rhinitis: Secondary | ICD-10-CM | POA: Diagnosis not present

## 2019-10-26 DIAGNOSIS — F430 Acute stress reaction: Secondary | ICD-10-CM | POA: Diagnosis not present

## 2019-10-26 DIAGNOSIS — I10 Essential (primary) hypertension: Secondary | ICD-10-CM | POA: Diagnosis not present

## 2019-10-27 DIAGNOSIS — J3081 Allergic rhinitis due to animal (cat) (dog) hair and dander: Secondary | ICD-10-CM | POA: Diagnosis not present

## 2019-10-27 DIAGNOSIS — J301 Allergic rhinitis due to pollen: Secondary | ICD-10-CM | POA: Diagnosis not present

## 2019-10-27 DIAGNOSIS — J3089 Other allergic rhinitis: Secondary | ICD-10-CM | POA: Diagnosis not present

## 2019-11-03 DIAGNOSIS — J301 Allergic rhinitis due to pollen: Secondary | ICD-10-CM | POA: Diagnosis not present

## 2019-11-03 DIAGNOSIS — J3089 Other allergic rhinitis: Secondary | ICD-10-CM | POA: Diagnosis not present

## 2019-11-03 DIAGNOSIS — J3081 Allergic rhinitis due to animal (cat) (dog) hair and dander: Secondary | ICD-10-CM | POA: Diagnosis not present

## 2019-11-06 ENCOUNTER — Telehealth: Payer: Self-pay | Admitting: Neurology
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
## 2019-11-06 NOTE — Telephone Encounter (Signed)
Patient called in and left a message. She stated she wanted to speak with Dr. Carles Collet. She has some questions. She is ok since it's Friday to get a call on Monday.

## 2019-11-07 NOTE — Telephone Encounter (Signed)
She does not need labs from my regard.  See old notes.  I have talked with her PCP physician about my concerns for her and agree with the mental health referral.  This is not in my area of expertise but she should f/u with PCP and mental health

## 2019-11-09 NOTE — Telephone Encounter (Signed)
Called pt and notified that Dr Tat does not have any recommendations for labs that she feels are necessary for her concerns. Told her that she recommends follow up with her PCP and mental health provider if she has one. She states she has a lab test tomorrow for narcotics and acknowledged Dr Doristine Devoid recommendation for cont f/u with her PCP.

## 2019-11-10 DIAGNOSIS — R531 Weakness: Secondary | ICD-10-CM | POA: Diagnosis not present

## 2019-11-10 DIAGNOSIS — J3081 Allergic rhinitis due to animal (cat) (dog) hair and dander: Secondary | ICD-10-CM | POA: Diagnosis not present

## 2019-11-10 DIAGNOSIS — J301 Allergic rhinitis due to pollen: Secondary | ICD-10-CM | POA: Diagnosis not present

## 2019-11-10 DIAGNOSIS — J3089 Other allergic rhinitis: Secondary | ICD-10-CM | POA: Diagnosis not present

## 2019-11-17 DIAGNOSIS — J301 Allergic rhinitis due to pollen: Secondary | ICD-10-CM | POA: Diagnosis not present

## 2019-11-17 DIAGNOSIS — J3081 Allergic rhinitis due to animal (cat) (dog) hair and dander: Secondary | ICD-10-CM | POA: Diagnosis not present

## 2019-11-17 DIAGNOSIS — J3089 Other allergic rhinitis: Secondary | ICD-10-CM | POA: Diagnosis not present

## 2019-11-24 DIAGNOSIS — J301 Allergic rhinitis due to pollen: Secondary | ICD-10-CM | POA: Diagnosis not present

## 2019-11-24 DIAGNOSIS — J3081 Allergic rhinitis due to animal (cat) (dog) hair and dander: Secondary | ICD-10-CM | POA: Diagnosis not present

## 2019-11-24 DIAGNOSIS — J3089 Other allergic rhinitis: Secondary | ICD-10-CM | POA: Diagnosis not present

## 2019-11-25 DIAGNOSIS — J3089 Other allergic rhinitis: Secondary | ICD-10-CM | POA: Diagnosis not present

## 2019-12-01 DIAGNOSIS — J301 Allergic rhinitis due to pollen: Secondary | ICD-10-CM | POA: Diagnosis not present

## 2019-12-01 DIAGNOSIS — J3081 Allergic rhinitis due to animal (cat) (dog) hair and dander: Secondary | ICD-10-CM | POA: Diagnosis not present

## 2019-12-01 DIAGNOSIS — J3089 Other allergic rhinitis: Secondary | ICD-10-CM | POA: Diagnosis not present

## 2019-12-08 DIAGNOSIS — J3089 Other allergic rhinitis: Secondary | ICD-10-CM | POA: Diagnosis not present

## 2019-12-08 DIAGNOSIS — J3081 Allergic rhinitis due to animal (cat) (dog) hair and dander: Secondary | ICD-10-CM | POA: Diagnosis not present

## 2019-12-08 DIAGNOSIS — J301 Allergic rhinitis due to pollen: Secondary | ICD-10-CM | POA: Diagnosis not present

## 2019-12-15 DIAGNOSIS — J3081 Allergic rhinitis due to animal (cat) (dog) hair and dander: Secondary | ICD-10-CM | POA: Diagnosis not present

## 2019-12-15 DIAGNOSIS — J3089 Other allergic rhinitis: Secondary | ICD-10-CM | POA: Diagnosis not present

## 2019-12-15 DIAGNOSIS — J301 Allergic rhinitis due to pollen: Secondary | ICD-10-CM | POA: Diagnosis not present

## 2019-12-17 DIAGNOSIS — J301 Allergic rhinitis due to pollen: Secondary | ICD-10-CM | POA: Diagnosis not present

## 2019-12-17 DIAGNOSIS — J3081 Allergic rhinitis due to animal (cat) (dog) hair and dander: Secondary | ICD-10-CM | POA: Diagnosis not present

## 2019-12-22 DIAGNOSIS — J301 Allergic rhinitis due to pollen: Secondary | ICD-10-CM | POA: Diagnosis not present

## 2019-12-22 DIAGNOSIS — J3081 Allergic rhinitis due to animal (cat) (dog) hair and dander: Secondary | ICD-10-CM | POA: Diagnosis not present

## 2019-12-22 DIAGNOSIS — J3089 Other allergic rhinitis: Secondary | ICD-10-CM | POA: Diagnosis not present

## 2019-12-29 DIAGNOSIS — J3089 Other allergic rhinitis: Secondary | ICD-10-CM | POA: Diagnosis not present

## 2019-12-29 DIAGNOSIS — J301 Allergic rhinitis due to pollen: Secondary | ICD-10-CM | POA: Diagnosis not present

## 2019-12-29 DIAGNOSIS — J3081 Allergic rhinitis due to animal (cat) (dog) hair and dander: Secondary | ICD-10-CM | POA: Diagnosis not present

## 2020-01-05 DIAGNOSIS — J301 Allergic rhinitis due to pollen: Secondary | ICD-10-CM | POA: Diagnosis not present

## 2020-01-05 DIAGNOSIS — J3089 Other allergic rhinitis: Secondary | ICD-10-CM | POA: Diagnosis not present

## 2020-01-05 DIAGNOSIS — J3081 Allergic rhinitis due to animal (cat) (dog) hair and dander: Secondary | ICD-10-CM | POA: Diagnosis not present

## 2020-01-08 DIAGNOSIS — H401132 Primary open-angle glaucoma, bilateral, moderate stage: Secondary | ICD-10-CM | POA: Diagnosis not present

## 2020-01-12 DIAGNOSIS — J301 Allergic rhinitis due to pollen: Secondary | ICD-10-CM | POA: Diagnosis not present

## 2020-01-12 DIAGNOSIS — J3081 Allergic rhinitis due to animal (cat) (dog) hair and dander: Secondary | ICD-10-CM | POA: Diagnosis not present

## 2020-01-12 DIAGNOSIS — J3089 Other allergic rhinitis: Secondary | ICD-10-CM | POA: Diagnosis not present

## 2020-01-19 DIAGNOSIS — J3081 Allergic rhinitis due to animal (cat) (dog) hair and dander: Secondary | ICD-10-CM | POA: Diagnosis not present

## 2020-01-19 DIAGNOSIS — J301 Allergic rhinitis due to pollen: Secondary | ICD-10-CM | POA: Diagnosis not present

## 2020-01-19 DIAGNOSIS — J3089 Other allergic rhinitis: Secondary | ICD-10-CM | POA: Diagnosis not present

## 2020-01-20 DIAGNOSIS — M1990 Unspecified osteoarthritis, unspecified site: Secondary | ICD-10-CM | POA: Diagnosis not present

## 2020-01-20 DIAGNOSIS — Z0000 Encounter for general adult medical examination without abnormal findings: Secondary | ICD-10-CM | POA: Diagnosis not present

## 2020-01-20 DIAGNOSIS — M8580 Other specified disorders of bone density and structure, unspecified site: Secondary | ICD-10-CM | POA: Diagnosis not present

## 2020-01-20 DIAGNOSIS — R609 Edema, unspecified: Secondary | ICD-10-CM | POA: Diagnosis not present

## 2020-01-20 DIAGNOSIS — I10 Essential (primary) hypertension: Secondary | ICD-10-CM | POA: Diagnosis not present

## 2020-01-20 DIAGNOSIS — E559 Vitamin D deficiency, unspecified: Secondary | ICD-10-CM | POA: Diagnosis not present

## 2020-01-20 DIAGNOSIS — G250 Essential tremor: Secondary | ICD-10-CM | POA: Diagnosis not present

## 2020-01-20 DIAGNOSIS — F430 Acute stress reaction: Secondary | ICD-10-CM | POA: Diagnosis not present

## 2020-01-20 DIAGNOSIS — Z23 Encounter for immunization: Secondary | ICD-10-CM | POA: Diagnosis not present

## 2020-01-26 DIAGNOSIS — J301 Allergic rhinitis due to pollen: Secondary | ICD-10-CM | POA: Diagnosis not present

## 2020-01-26 DIAGNOSIS — J3081 Allergic rhinitis due to animal (cat) (dog) hair and dander: Secondary | ICD-10-CM | POA: Diagnosis not present

## 2020-01-26 DIAGNOSIS — J3089 Other allergic rhinitis: Secondary | ICD-10-CM | POA: Diagnosis not present

## 2020-01-27 DIAGNOSIS — X32XXXD Exposure to sunlight, subsequent encounter: Secondary | ICD-10-CM | POA: Diagnosis not present

## 2020-01-27 DIAGNOSIS — L570 Actinic keratosis: Secondary | ICD-10-CM | POA: Diagnosis not present

## 2020-01-27 DIAGNOSIS — L821 Other seborrheic keratosis: Secondary | ICD-10-CM | POA: Diagnosis not present

## 2020-01-27 DIAGNOSIS — Z1283 Encounter for screening for malignant neoplasm of skin: Secondary | ICD-10-CM | POA: Diagnosis not present

## 2020-02-02 DIAGNOSIS — J3081 Allergic rhinitis due to animal (cat) (dog) hair and dander: Secondary | ICD-10-CM | POA: Diagnosis not present

## 2020-02-02 DIAGNOSIS — J301 Allergic rhinitis due to pollen: Secondary | ICD-10-CM | POA: Diagnosis not present

## 2020-02-02 DIAGNOSIS — J3089 Other allergic rhinitis: Secondary | ICD-10-CM | POA: Diagnosis not present

## 2020-02-09 DIAGNOSIS — J301 Allergic rhinitis due to pollen: Secondary | ICD-10-CM | POA: Diagnosis not present

## 2020-02-09 DIAGNOSIS — J3081 Allergic rhinitis due to animal (cat) (dog) hair and dander: Secondary | ICD-10-CM | POA: Diagnosis not present

## 2020-02-09 DIAGNOSIS — T63443D Toxic effect of venom of bees, assault, subsequent encounter: Secondary | ICD-10-CM | POA: Diagnosis not present

## 2020-02-09 DIAGNOSIS — J3089 Other allergic rhinitis: Secondary | ICD-10-CM | POA: Diagnosis not present

## 2020-02-16 DIAGNOSIS — J301 Allergic rhinitis due to pollen: Secondary | ICD-10-CM | POA: Diagnosis not present

## 2020-02-16 DIAGNOSIS — J3089 Other allergic rhinitis: Secondary | ICD-10-CM | POA: Diagnosis not present

## 2020-02-16 DIAGNOSIS — J3081 Allergic rhinitis due to animal (cat) (dog) hair and dander: Secondary | ICD-10-CM | POA: Diagnosis not present

## 2020-02-18 DIAGNOSIS — J3081 Allergic rhinitis due to animal (cat) (dog) hair and dander: Secondary | ICD-10-CM | POA: Diagnosis not present

## 2020-02-18 DIAGNOSIS — J301 Allergic rhinitis due to pollen: Secondary | ICD-10-CM | POA: Diagnosis not present

## 2020-02-23 DIAGNOSIS — Z23 Encounter for immunization: Secondary | ICD-10-CM | POA: Diagnosis not present

## 2020-03-08 DIAGNOSIS — J3089 Other allergic rhinitis: Secondary | ICD-10-CM | POA: Diagnosis not present

## 2020-03-08 DIAGNOSIS — J301 Allergic rhinitis due to pollen: Secondary | ICD-10-CM | POA: Diagnosis not present

## 2020-03-08 DIAGNOSIS — J3081 Allergic rhinitis due to animal (cat) (dog) hair and dander: Secondary | ICD-10-CM | POA: Diagnosis not present

## 2020-03-10 DIAGNOSIS — G479 Sleep disorder, unspecified: Secondary | ICD-10-CM | POA: Diagnosis not present

## 2020-03-10 DIAGNOSIS — R2689 Other abnormalities of gait and mobility: Secondary | ICD-10-CM | POA: Diagnosis not present

## 2020-03-10 DIAGNOSIS — R519 Headache, unspecified: Secondary | ICD-10-CM | POA: Diagnosis not present

## 2020-03-10 DIAGNOSIS — G250 Essential tremor: Secondary | ICD-10-CM | POA: Diagnosis not present

## 2020-03-11 ENCOUNTER — Telehealth: Payer: Self-pay | Admitting: Family Medicine
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
## 2020-03-14 NOTE — Telephone Encounter (Signed)
Spoke with Daughter and informed her that she is not on the [facility] and we can not speak with her or give her any information. She voiced understanding and stated she is well aware of HIPPA, but she just wanted to update Dr Tat with the information that she knows.   Spoke with Kem Kays who is on DPR and gave her Dr Doristine Devoid recommendations. Informed her that the patients daughter called but we could not give her any info. She voiced understanding and thanked me for calling.

## 2020-03-15 ENCOUNTER — Encounter: Payer: Self-pay | Admitting: Counselor

## 2020-03-15 DIAGNOSIS — J3081 Allergic rhinitis due to animal (cat) (dog) hair and dander: Secondary | ICD-10-CM | POA: Diagnosis not present

## 2020-03-15 DIAGNOSIS — J301 Allergic rhinitis due to pollen: Secondary | ICD-10-CM | POA: Diagnosis not present

## 2020-03-15 DIAGNOSIS — J3089 Other allergic rhinitis: Secondary | ICD-10-CM | POA: Diagnosis not present

## 2020-03-16 ENCOUNTER — Telehealth: Payer: Self-pay

## 2020-03-16 NOTE — Telephone Encounter (Signed)
NOTES ON FILE FROM   NEW [place] [facility], SENT REFERRAL TO SCHEDULING

## 2020-03-21 ENCOUNTER — Ambulatory Visit: Payer: Medicare Other | Attending: Family Medicine | Admitting: Physical Therapy

## 2020-03-21 ENCOUNTER — Other Ambulatory Visit: Payer: Self-pay | Admitting: Family Medicine

## 2020-03-21 ENCOUNTER — Other Ambulatory Visit: Payer: Self-pay

## 2020-03-21 DIAGNOSIS — R2689 Other abnormalities of gait and mobility: Secondary | ICD-10-CM

## 2020-03-21 DIAGNOSIS — Z1231 Encounter for screening mammogram for malignant neoplasm of breast: Secondary | ICD-10-CM

## 2020-03-21 DIAGNOSIS — Z9181 History of falling: Secondary | ICD-10-CM

## 2020-03-21 DIAGNOSIS — M6281 Muscle weakness (generalized): Secondary | ICD-10-CM

## 2020-03-21 DIAGNOSIS — R262 Difficulty in walking, not elsewhere classified: Secondary | ICD-10-CM | POA: Diagnosis not present

## 2020-03-21 DIAGNOSIS — R2681 Unsteadiness on feet: Secondary | ICD-10-CM | POA: Diagnosis not present

## 2020-03-22 DIAGNOSIS — J3081 Allergic rhinitis due to animal (cat) (dog) hair and dander: Secondary | ICD-10-CM | POA: Diagnosis not present

## 2020-03-22 DIAGNOSIS — J301 Allergic rhinitis due to pollen: Secondary | ICD-10-CM | POA: Diagnosis not present

## 2020-03-22 DIAGNOSIS — J3089 Other allergic rhinitis: Secondary | ICD-10-CM | POA: Diagnosis not present

## 2020-03-22 NOTE — Therapy (Addendum)
[facility] [address] [place], [place], [address] Phone: [phone]   Fax:  [phone]  Physical Therapy Evaluation  Patient Details  Name: Annette Mathews MRN: [id number] Date of Birth: [birth year] Referring Provider (PT): Lona Kettle, MD   Encounter Date: 03/21/2020   PT End of Session - 03/22/20 0951    Visit Number 1    Number of Visits 13    Date for PT Re-Evaluation 06/20/20   written for 6 week POC   Authorization Type Medicare part A and B    PT Start Time 7001    PT Stop Time 1448    PT Time Calculation (min) 50 min    Equipment Utilized During Treatment Gait belt    Activity Tolerance Patient tolerated treatment well    Behavior During Therapy [facility] for tasks assessed/performed           Past Medical History:  Diagnosis Date  . Anxiety   . Glaucoma   . Hypertension     Past Surgical History:  Procedure Laterality Date  . FOOT SURGERY     multiple  . Hysterectomy    . KNEE SURGERY Bilateral    TKA    There were no vitals filed for this visit.    Subjective Assessment - 03/21/20 1406    Subjective Received covid booster on 02/23/20, afterwards states that her balance has gotten worse. Reports that her tremors and balance can be worse based on the presence of a man in her attic (has been documented thoroughly by pt's PCP and Dr. Carles Collet). Has had some falls by tripping. Reports getting out of the tub too quickly can make her lose her balance.Reports that her strength feels fine. Reports she is still working in the yard and doing all the cleaning in her house.    Pertinent History HTN, anxiety, essential tremors (per Dr Tat), hx of B knee replacements    Limitations Walking    Patient Stated Goals see what can be done for her balance.    Currently in Pain? No/denies              [facility] PT Assessment - 03/21/20 1412      Assessment   Medical Diagnosis balance impairments    Referring  Provider (PT) Lona Kettle, MD    Onset Date/Surgical Date 02/23/20    Hand Dominance Right    Prior Therapy for hx of B knee replacements      Precautions   Precautions Fall      Balance Screen   Has the patient fallen in the past 6 months Yes    How many times? 6   "not really sure how"   Has the patient had a decrease in activity level because of a fear of falling?  No    Is the patient reluctant to leave their home because of a fear of falling?  No      Home Environment   Living Environment Private residence    Living Arrangements Alone    Type of [place] to enter    Entrance Stairs-Number of Steps 2    [place] One level    [place] None    Additional Comments does not use the front door due to there not being railings to get in and out of      Prior Function   Level of Independence Independent  Leisure loves to read, working in the yard      Editor, commissioning Impaired Detail    Additional Comments incr difficulty detecting on L lateral leg due to knee replacement      Coordination   Gross Motor Movements are Fluid and Coordinated No    Coordination and Movement Description incr dificulty with RLE      ROM / Strength   AROM / PROM / Strength Strength      Strength   Strength Assessment Site Knee;Ankle;Hip    Right/Left Hip Left;Right    Right Hip Flexion 3+/5    Left Hip Flexion 4-/5    Right/Left Knee Right;Left    Right Knee Flexion 5/5    Right Knee Extension 5/5    Left Knee Flexion 4+/5    Left Knee Extension 5/5    Right/Left Ankle Right;Left    Right Ankle Dorsiflexion 5/5    Left Ankle Dorsiflexion 5/5      Transfers   Transfers Stand to Sit;Sit to Stand    Sit to Stand 5: Supervision;Without upper extremity assist    Five time sit to stand comments  19.1 seconds without UE support    Stand to Sit 5: Supervision;Without upper extremity assist      Ambulation/Gait    Ambulation/Gait Yes    Ambulation/Gait Assistance 4: Min guard    Ambulation Distance (Feet) --   clinic distances   Assistive device None    Gait Pattern Step-through pattern;Decreased arm swing - left;Decreased arm swing - right;Lateral hip instability;Wide base of support   B toe out   Ambulation Surface Level;Indoor    Gait velocity 13.13 seconds = 2.49 ft/sec    Stairs Yes    Stairs Assistance 5: Supervision    Stairs Assistance Details (indicate cue type and reason) ascends leading with RLE and holding on with both hands to L handrails, descending with LLE with RUE only on railing    Stair Management Technique One rail Right;Step to pattern;Forwards    Number of Stairs 4    Height of Stairs 6    Gait Comments pt needing to hold onto objects intermittently throughout session for balance. when performing head motions, pt needing min guard and pt slowing down gait.  reports feeling more cautious with curbs, getting in and out of the car, stairs      Standardized Balance Assessment   Standardized Balance Assessment Timed Up and Go Test      Timed Up and Go Test   Normal TUG (seconds) 15.56      High Level Balance   High Level Balance Comments mCTSIB: condition 1: 30 seconds, condition 2: 30 seconds, mild postural sway and pt reporting feeling more unsteady, will check remainder at next session, did not have time today due to time constraints                      Objective measurements completed on examination: See above findings.               PT Education - 03/22/20 0951    Education Details clinical findings, POC    Person(s) Educated Patient    Methods Explanation    Comprehension Verbalized understanding            PT Short Term Goals - 03/22/20 0954      PT SHORT TERM GOAL #1   Title Pt will decr 5x sit <> stand time to 17 seconds  or less without UE support in order to demo decr fall risk. ALL STGS DUE 04/12/20    Baseline 19.1 seconds without UE  support from chair    Time 3    Period Weeks    Status New    Target Date 04/12/20      PT SHORT TERM GOAL #2   Title Pt will undergo further assessment of DGI with LTG to be written as appropriate.    Time 3    Period Weeks    Status New      PT SHORT TERM GOAL #3   Title Pt will improve gait speed to at least 2.7 ft/sec with no AD vs. LRAD in order to demo improved gait efficiency.    Time 3    Period Weeks    Status New      PT SHORT TERM GOAL #4   Title Pt will ambulate 230' over level indoor surfaces with supervision with no AD vs. LRAD in order to demo improved mobility.    Baseline currently no AD with pt needing to touch objects at times for balance, min guard    Time 3    Period Weeks    Status New             PT Long Term Goals - 03/22/20 1000      PT LONG TERM GOAL #1   Title Pt will be independent with final HEP in order to build upon functional gains made in therapy. ALL LTGS DUE 05/03/20    Time 6    Period Weeks    Status New    Target Date 05/03/20      PT LONG TERM GOAL #2   Title DGI goal to be written as appropriate in order to demo decr fall risk.    Time 6    Period Weeks    Status New      PT LONG TERM GOAL #3   Title Pt will perform 4 steps with mod I with single handrail and step to pattern in order to safely enter/exit stairs at home.    Time 6    Period Weeks    Status New      PT LONG TERM GOAL #4   Title Pt will decr TUG to 13 seconds or less with no AD vs. LRAD in order to demo decr fall risk.    Baseline 15.56 seconds    Time 6    Period Weeks    Status New      PT LONG TERM GOAL #5   Title Pt will ambulate outdoors at least 230' over paved and grass surfaces with supervision with LRAD vs. no AD in order to demo improved community mobility.    Time 6    Period Weeks    Status New                  Plan - 03/22/20 1002    Clinical Impression Statement Patient is a 74 year old female referred to Neuro OPPT for  balance impairments.  Pt reports that she received her Covid booster on 02/23/20 and reports that her balance has gotten worse afterwards. Has had approx.. 6 falls in the past month. Reports that they are caused by her tripping. Pt reports there is a man that has been living in her attic for the past 19 months and her balance/tremors are worse because of the stress from this. Pt's PCP Dr. Harrington Challenger and Dr. Carles Collet  have thoroughly documented this(been going on for over a yar)/referred pt to appropriate providers (with pt not wanting to be seen), and social work has been involved as well. PMH is significant for: HTN, anxiety, essential tremors (per Dr Tat), hx of B knee replacements. The following deficits were present during the exam:  gait abnormalities, impaired balance, decr BLE strength, impaired coordination, impaired sensation. Pt ambulated into clinic with no AD and throughout session, but needing to hold onto objects for balance. Based on 5x sit <> stand and TUG pt is at an incr risk for falls. Pt's gait speed of 2.49 ft/sec indicates that pt is a limited community ambulator. Pt would benefit from skilled PT to address these impairments and functional limitations to maximize functional mobility independence    Personal Factors and Comorbidities Comorbidity 3+;Past/Current Experience    Comorbidities HTN, anxiety, essential tremors (per Dr Tat), hx of B knee replacements    Examination-Activity Limitations Transfers;Stairs;Locomotion Level    Examination-Participation Restrictions Community Activity    Stability/Clinical Decision Making Evolving/Moderate complexity    Clinical Decision Making Moderate    Rehab Potential Good    PT Frequency 2x / week    PT Duration 6 weeks    PT Treatment/Interventions ADLs/Self Care Home Management;DME Instruction;Stair training;Gait training;Functional mobility training;Therapeutic activities;Therapeutic exercise;Balance training;Patient/family education;Neuromuscular  re-education;Vestibular    PT Next Visit Plan check DGI, remainder of mCTSIB, initial HEP for strengthening/balance. gait training with [facility] for incr safety.    Consulted and Agree with Plan of Care Patient           Patient will benefit from skilled therapeutic intervention in order to improve the following deficits and impairments:  Abnormal gait,Decreased activity tolerance,Decreased balance,Decreased coordination,Difficulty walking,Decreased safety awareness,Decreased strength,Impaired sensation  Visit Diagnosis: Unsteadiness on feet  History of falling  Muscle weakness (generalized)  Other abnormalities of gait and mobility  Difficulty in walking, not elsewhere classified     Problem List There are no problems to display for this patient.   Arliss Journey, PT, DPT  03/22/2020, 2:29 PM  [facility] [address] [place], [place], [address] Phone: [phone]   Fax:  [phone]  Name: Annette Mathews MRN: [id number] Date of Birth: 1946-01-03

## 2020-03-22 NOTE — Addendum Note (Signed)
Addended by: Arliss Journey on: 03/22/2020 02:30 PM   Modules accepted: Orders

## 2020-03-25 ENCOUNTER — Encounter: Payer: Self-pay | Admitting: Physical Therapy

## 2020-03-25 ENCOUNTER — Other Ambulatory Visit: Payer: Self-pay

## 2020-03-25 ENCOUNTER — Ambulatory Visit: Payer: Medicare Other | Admitting: Physical Therapy

## 2020-03-25 VITALS — BP 125/73 | HR 54

## 2020-03-25 DIAGNOSIS — R262 Difficulty in walking, not elsewhere classified: Secondary | ICD-10-CM

## 2020-03-25 DIAGNOSIS — R2689 Other abnormalities of gait and mobility: Secondary | ICD-10-CM

## 2020-03-25 DIAGNOSIS — R2681 Unsteadiness on feet: Secondary | ICD-10-CM

## 2020-03-25 DIAGNOSIS — M6281 Muscle weakness (generalized): Secondary | ICD-10-CM | POA: Diagnosis not present

## 2020-03-25 DIAGNOSIS — Z9181 History of falling: Secondary | ICD-10-CM | POA: Diagnosis not present

## 2020-03-25 NOTE — Patient Instructions (Signed)
Access Code: [email or web address] URL: [email or web address] Date: 03/25/2020 Prepared by: Janann August  Exercises Standing Balance with Eyes Closed on Foam - 1-2 x daily - 5 x weekly - 3 sets - 30 hold Romberg Stance on Foam Pad with Head Rotation - 1-2 x daily - 5 x weekly - 3 sets - 5 reps Standing March with Counter Support - 1 x daily - 5 x weekly - 2 sets - 10 reps

## 2020-03-25 NOTE — Therapy (Signed)
[facility] [address] [place], [place], [address] Phone: [phone]   Fax:  [phone]  Physical Therapy Treatment  Patient Details  Name: Annette Mathews MRN: [id number] Date of Birth: [birth year] Referring Provider (PT): Lona Kettle, MD   Encounter Date: 03/25/2020   PT End of Session - 03/25/20 1429    Visit Number 2    Number of Visits 13    Date for PT Re-Evaluation 06/20/20   written for 6 week POC   Authorization Type Medicare part A and B    PT Start Time 6789   PT able to see pt early   PT Stop Time 1418    PT Time Calculation (min) 43 min    Equipment Utilized During Treatment Gait belt    Activity Tolerance Patient tolerated treatment well    Behavior During Therapy [facility] for tasks assessed/performed           Past Medical History:  Diagnosis Date  . Anxiety   . Glaucoma   . Hypertension     Past Surgical History:  Procedure Laterality Date  . FOOT SURGERY     multiple  . Hysterectomy    . KNEE SURGERY Bilateral    TKA    Vitals:   03/25/20 1342  BP: 125/73  Pulse: (!) 54     Subjective Assessment - 03/25/20 1337    Subjective Was working in the yard the past 2 days and reports feeling sore today. No falls.    Pertinent History HTN, anxiety, essential tremors (per Dr Tat), hx of B knee replacements    Limitations Walking    Patient Stated Goals see what can be done for her balance.    Currently in Pain? Yes    Pain Score 5     Pain Location Knee    Pain Orientation Left    Pain Descriptors / Indicators Sore    Aggravating Factors  from being down on her knees pulling out weeds    Pain Relieving Factors staying off of it              [facility] PT Assessment - 03/25/20 1343      Standardized Balance Assessment   Standardized Balance Assessment Dynamic Gait Index      Dynamic Gait Index   Level Surface Moderate Impairment    Change in Gait Speed Mild Impairment    Gait  with Horizontal Head Turns Mild Impairment    Gait with Vertical Head Turns Moderate Impairment    Gait and Pivot Turn Mild Impairment    Step Over Obstacle Moderate Impairment    Step Around Obstacles Normal    Steps Moderate Impairment    Total Score 13    DGI comment: 13/24      High Level Balance   High Level Balance Comments mCTSIB: condition 3- 30 seconds, condition 4- 30 seconds, incr postural sway                         [facility] Adult PT Treatment/Exercise - 03/25/20 0001      Ambulation/Gait   Ambulation/Gait Yes    Ambulation/Gait Assistance 5: Supervision    Ambulation/Gait Assistance Details throughout session with no AD    Assistive device None    Gait Pattern Step-through pattern;Decreased arm swing - left;Decreased arm swing - right;Lateral hip instability;Wide base of support   B toe out   Ambulation Surface Level;Indoor  Access Code: [email or web address] URL: [email or web address] Date: 03/25/2020 Prepared by: Janann August  Initiated HEP:   Exercises Standing Balance with Eyes Closed on Foam - 1-2 x daily - 5 x weekly - 3 sets - 30 hold - with feet apart  Romberg Stance on Foam Pad with Head Rotation - 1-2 x daily - 5 x weekly - 3 sets - 5 reps - with eyes open, 2 x 5 reps head nods, 2 x 5 reps head turns Standing March with Counter Support - 1 x daily - 5 x weekly - 2 sets - 10 reps - with BUE support         PT Education - 03/25/20 1428    Education Details initial HEP, purpose of 3 balance systems, results of DGI    Person(s) Educated Patient    Methods Explanation;Demonstration;Handout    Comprehension Verbalized understanding;Returned demonstration            PT Short Term Goals - 03/25/20 1436      PT SHORT TERM GOAL #1   Title Pt will decr 5x sit <> stand time to 17 seconds or less without UE support in order to demo decr fall risk. ALL STGS DUE 04/12/20    Baseline 19.1 seconds without UE support from chair     Time 3    Period Weeks    Status New    Target Date 04/12/20      PT SHORT TERM GOAL #2   Title Pt will undergo further assessment of DGI with LTG to be written as appropriate.    Baseline 13/24 on 03/25/20 - performed with no AD    Time 3    Period Weeks    Status Achieved      PT SHORT TERM GOAL #3   Title Pt will improve gait speed to at least 2.7 ft/sec with no AD vs. LRAD in order to demo improved gait efficiency.    Time 3    Period Weeks    Status New      PT SHORT TERM GOAL #4   Title Pt will ambulate 230' over level indoor surfaces with supervision with no AD vs. LRAD in order to demo improved mobility.    Baseline currently no AD with pt needing to touch objects at times for balance, min guard    Time 3    Period Weeks    Status New             PT Long Term Goals - 03/25/20 1436      PT LONG TERM GOAL #1   Title Pt will be independent with final HEP in order to build upon functional gains made in therapy. ALL LTGS DUE 05/03/20    Time 6    Period Weeks    Status New      PT LONG TERM GOAL #2   Title Pt will improve DGI score to at least 16/24 in order to demo decr fall risk.    Baseline 13/24 performed with no AD    Time 6    Period Weeks    Status Revised      PT LONG TERM GOAL #3   Title Pt will perform 4 steps with mod I with single handrail and step to pattern in order to safely enter/exit stairs at home.    Time 6    Period Weeks    Status New      PT LONG TERM GOAL #4   Title Pt will  decr TUG to 13 seconds or less with no AD vs. LRAD in order to demo decr fall risk.    Baseline 15.56 seconds    Time 6    Period Weeks    Status New      PT LONG TERM GOAL #5   Title Pt will ambulate outdoors at least 230' over paved and grass surfaces with supervision with LRAD vs. no AD in order to demo improved community mobility.    Time 6    Period Weeks    Status New                 Plan - 03/25/20 1437    Clinical Impression Statement  Performed the DGI today with pt scoring a 13/24 with no AD, indicating that pt is at an increased risk for falls. LTG updated as appropriate. Suggested trying use of a SPC for improved balance/stability for gait and fall risk, pt potentially willing to try in therapy, but states she would not use it at home. Remainder of session focused on initiating HEP for counter and corner balance for incr vestibular input. Will continue to progress towards LTGs.    Personal Factors and Comorbidities Comorbidity 3+;Past/Current Experience    Comorbidities HTN, anxiety, essential tremors (per Dr Tat), hx of B knee replacements    Examination-Activity Limitations Transfers;Stairs;Locomotion Level    Examination-Participation Restrictions Community Activity    Stability/Clinical Decision Making Evolving/Moderate complexity    Rehab Potential Good    PT Frequency 2x / week    PT Duration 6 weeks    PT Treatment/Interventions ADLs/Self Care Home Management;DME Instruction;Stair training;Gait training;Functional mobility training;Therapeutic activities;Therapeutic exercise;Balance training;Patient/family education;Neuromuscular re-education;Vestibular    PT Next Visit Plan add to HEP for strength/balance. balance with incr vestibular input, on compliant surfaces, functional BLE strength. gait training with SPC for incr safety.    Consulted and Agree with Plan of Care Patient           Patient will benefit from skilled therapeutic intervention in order to improve the following deficits and impairments:  Abnormal gait,Decreased activity tolerance,Decreased balance,Decreased coordination,Difficulty walking,Decreased safety awareness,Decreased strength,Impaired sensation  Visit Diagnosis: Unsteadiness on feet  History of falling  Muscle weakness (generalized)  Other abnormalities of gait and mobility  Difficulty in walking, not elsewhere classified     Problem List There are no problems to display for  this patient.   Arliss Journey, PT, DPT  03/25/2020, 2:38 PM  [facility] [address] [place], [place], [address] Phone: [phone]   Fax:  [phone]  Name: Rylen Hou MRN: [id number] Date of Birth: 1945/08/06

## 2020-03-30 ENCOUNTER — Ambulatory Visit: Payer: Medicare Other | Admitting: Physical Therapy

## 2020-04-04 ENCOUNTER — Ambulatory Visit: Payer: Medicare Other | Admitting: Physical Therapy

## 2020-04-04 DIAGNOSIS — L255 Unspecified contact dermatitis due to plants, except food: Secondary | ICD-10-CM | POA: Diagnosis not present

## 2020-04-06 ENCOUNTER — Ambulatory Visit: Payer: Medicare Other | Admitting: Physical Therapy

## 2020-04-06 ENCOUNTER — Encounter: Payer: Self-pay | Admitting: Physical Therapy

## 2020-04-06 ENCOUNTER — Other Ambulatory Visit: Payer: Self-pay

## 2020-04-06 DIAGNOSIS — R2689 Other abnormalities of gait and mobility: Secondary | ICD-10-CM | POA: Diagnosis not present

## 2020-04-06 DIAGNOSIS — R2681 Unsteadiness on feet: Secondary | ICD-10-CM | POA: Diagnosis not present

## 2020-04-06 DIAGNOSIS — R262 Difficulty in walking, not elsewhere classified: Secondary | ICD-10-CM | POA: Diagnosis not present

## 2020-04-06 DIAGNOSIS — Z9181 History of falling: Secondary | ICD-10-CM | POA: Diagnosis not present

## 2020-04-06 DIAGNOSIS — M6281 Muscle weakness (generalized): Secondary | ICD-10-CM
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
## 2020-04-11 ENCOUNTER — Ambulatory Visit: Payer: Medicare Other | Admitting: Physical Therapy

## 2020-04-15 ENCOUNTER — Ambulatory Visit: Payer: Medicare Other | Admitting: Physical Therapy

## 2020-04-18 ENCOUNTER — Ambulatory Visit: Payer: Medicare Other | Admitting: Physical Therapy

## 2020-04-20 ENCOUNTER — Ambulatory Visit: Payer: Medicare Other | Admitting: Physical Therapy

## 2020-04-22 ENCOUNTER — Encounter: Payer: Medicare Other | Admitting: Counselor

## 2020-04-22 ENCOUNTER — Ambulatory Visit: Payer: Medicare Other | Admitting: Physical Therapy

## 2020-04-25 ENCOUNTER — Ambulatory Visit: Payer: Medicare Other | Admitting: Physical Therapy

## 2020-04-29 ENCOUNTER — Ambulatory Visit: Payer: Medicare Other | Attending: Family Medicine | Admitting: Physical Therapy

## 2020-04-29 ENCOUNTER — Encounter: Payer: Medicare Other | Admitting: Counselor

## 2020-05-02 ENCOUNTER — Ambulatory Visit
Admission: RE | Admit: 2020-05-02 | Discharge: 2020-05-02 | Disposition: A | Discharge status: Home / Self Care | Payer: Medicare Other | Source: Ambulatory Visit | Attending: Family Medicine | Admitting: Family Medicine

## 2020-05-02 ENCOUNTER — Other Ambulatory Visit: Payer: Self-pay

## 2020-05-02 DIAGNOSIS — Z1231 Encounter for screening mammogram for malignant neoplasm of breast: Secondary | ICD-10-CM

## 2020-05-03 ENCOUNTER — Ambulatory Visit: Payer: Medicare Other | Admitting: Physical Therapy

## 2020-05-03 DIAGNOSIS — J301 Allergic rhinitis due to pollen: Secondary | ICD-10-CM | POA: Diagnosis not present

## 2020-05-03 DIAGNOSIS — J3081 Allergic rhinitis due to animal (cat) (dog) hair and dander: Secondary | ICD-10-CM | POA: Diagnosis not present

## 2020-05-03 DIAGNOSIS — J3089 Other allergic rhinitis: Secondary | ICD-10-CM | POA: Diagnosis not present

## 2020-05-06 ENCOUNTER — Ambulatory Visit: Payer: Medicare Other | Admitting: Physical Therapy

## 2020-05-11 DIAGNOSIS — J3081 Allergic rhinitis due to animal (cat) (dog) hair and dander: Secondary | ICD-10-CM | POA: Diagnosis not present

## 2020-05-11 DIAGNOSIS — J301 Allergic rhinitis due to pollen: Secondary | ICD-10-CM | POA: Diagnosis not present

## 2020-05-11 DIAGNOSIS — J3089 Other allergic rhinitis: Secondary | ICD-10-CM | POA: Diagnosis not present

## 2020-05-17 DIAGNOSIS — J3081 Allergic rhinitis due to animal (cat) (dog) hair and dander: Secondary | ICD-10-CM | POA: Diagnosis not present

## 2020-05-17 DIAGNOSIS — J3089 Other allergic rhinitis: Secondary | ICD-10-CM | POA: Diagnosis not present

## 2020-05-17 DIAGNOSIS — J301 Allergic rhinitis due to pollen: Secondary | ICD-10-CM | POA: Diagnosis not present

## 2020-05-24 DIAGNOSIS — J3081 Allergic rhinitis due to animal (cat) (dog) hair and dander: Secondary | ICD-10-CM | POA: Diagnosis not present

## 2020-05-24 DIAGNOSIS — J3089 Other allergic rhinitis: Secondary | ICD-10-CM | POA: Diagnosis not present

## 2020-05-24 DIAGNOSIS — J301 Allergic rhinitis due to pollen: Secondary | ICD-10-CM | POA: Diagnosis not present

## 2020-05-31 DIAGNOSIS — J3081 Allergic rhinitis due to animal (cat) (dog) hair and dander: Secondary | ICD-10-CM | POA: Diagnosis not present

## 2020-05-31 DIAGNOSIS — J301 Allergic rhinitis due to pollen: Secondary | ICD-10-CM | POA: Diagnosis not present

## 2020-05-31 DIAGNOSIS — J3089 Other allergic rhinitis: Secondary | ICD-10-CM | POA: Diagnosis not present

## 2020-06-02 DIAGNOSIS — H2513 Age-related nuclear cataract, bilateral: Secondary | ICD-10-CM | POA: Diagnosis not present

## 2020-06-02 DIAGNOSIS — H401132 Primary open-angle glaucoma, bilateral, moderate stage: Secondary | ICD-10-CM | POA: Diagnosis not present

## 2020-06-14 DIAGNOSIS — J301 Allergic rhinitis due to pollen: Secondary | ICD-10-CM | POA: Diagnosis not present

## 2020-06-14 DIAGNOSIS — J3081 Allergic rhinitis due to animal (cat) (dog) hair and dander: Secondary | ICD-10-CM | POA: Diagnosis not present

## 2020-06-14 DIAGNOSIS — J3089 Other allergic rhinitis: Secondary | ICD-10-CM | POA: Diagnosis not present

## 2020-06-21 DIAGNOSIS — J3089 Other allergic rhinitis: Secondary | ICD-10-CM | POA: Diagnosis not present

## 2020-06-21 DIAGNOSIS — J3081 Allergic rhinitis due to animal (cat) (dog) hair and dander: Secondary | ICD-10-CM | POA: Diagnosis not present

## 2020-06-21 DIAGNOSIS — J301 Allergic rhinitis due to pollen: Secondary | ICD-10-CM | POA: Diagnosis not present

## 2020-06-28 DIAGNOSIS — J3081 Allergic rhinitis due to animal (cat) (dog) hair and dander: Secondary | ICD-10-CM | POA: Diagnosis not present

## 2020-06-28 DIAGNOSIS — J301 Allergic rhinitis due to pollen: Secondary | ICD-10-CM | POA: Diagnosis not present

## 2020-06-28 DIAGNOSIS — J3089 Other allergic rhinitis: Secondary | ICD-10-CM | POA: Diagnosis not present

## 2020-07-05 DIAGNOSIS — J301 Allergic rhinitis due to pollen: Secondary | ICD-10-CM | POA: Diagnosis not present

## 2020-07-05 DIAGNOSIS — J3089 Other allergic rhinitis: Secondary | ICD-10-CM | POA: Diagnosis not present

## 2020-07-05 DIAGNOSIS — J3081 Allergic rhinitis due to animal (cat) (dog) hair and dander: Secondary | ICD-10-CM | POA: Diagnosis not present

## 2020-07-08 ENCOUNTER — Telehealth: Payer: Self-pay | Admitting: Neurology
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
## 2020-07-08 NOTE — Telephone Encounter (Signed)
Copy of phone message sent to pcp.
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
## 2020-07-12 DIAGNOSIS — J3089 Other allergic rhinitis: Secondary | ICD-10-CM | POA: Diagnosis not present

## 2020-07-12 DIAGNOSIS — J3081 Allergic rhinitis due to animal (cat) (dog) hair and dander: Secondary | ICD-10-CM | POA: Diagnosis not present

## 2020-07-12 DIAGNOSIS — T63443A Toxic effect of venom of bees, assault, initial encounter: Secondary | ICD-10-CM | POA: Diagnosis not present

## 2020-07-12 DIAGNOSIS — J301 Allergic rhinitis due to pollen: Secondary | ICD-10-CM | POA: Diagnosis not present

## 2020-07-16 ENCOUNTER — Other Ambulatory Visit: Payer: Self-pay | Admitting: Family Medicine

## 2020-07-16 DIAGNOSIS — M8580 Other specified disorders of bone density and structure, unspecified site: Secondary | ICD-10-CM

## 2020-07-19 DIAGNOSIS — J301 Allergic rhinitis due to pollen: Secondary | ICD-10-CM | POA: Diagnosis not present

## 2020-07-19 DIAGNOSIS — J3089 Other allergic rhinitis: Secondary | ICD-10-CM | POA: Diagnosis not present

## 2020-07-19 DIAGNOSIS — J3081 Allergic rhinitis due to animal (cat) (dog) hair and dander: Secondary | ICD-10-CM | POA: Diagnosis not present

## 2020-07-20 DIAGNOSIS — F411 Generalized anxiety disorder: Secondary | ICD-10-CM | POA: Diagnosis not present

## 2020-07-20 DIAGNOSIS — G250 Essential tremor: Secondary | ICD-10-CM | POA: Diagnosis not present

## 2020-07-20 DIAGNOSIS — I10 Essential (primary) hypertension: Secondary | ICD-10-CM | POA: Diagnosis not present

## 2020-07-20 DIAGNOSIS — G479 Sleep disorder, unspecified: Secondary | ICD-10-CM | POA: Diagnosis not present
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
## 2020-07-26 DIAGNOSIS — J3081 Allergic rhinitis due to animal (cat) (dog) hair and dander: Secondary | ICD-10-CM | POA: Diagnosis not present

## 2020-07-26 DIAGNOSIS — J301 Allergic rhinitis due to pollen: Secondary | ICD-10-CM | POA: Diagnosis not present

## 2020-07-26 DIAGNOSIS — J3089 Other allergic rhinitis: Secondary | ICD-10-CM | POA: Diagnosis not present

## 2020-07-27 ENCOUNTER — Encounter: Payer: Self-pay | Admitting: Neurology

## 2020-07-27 ENCOUNTER — Other Ambulatory Visit: Payer: Self-pay

## 2020-07-27 ENCOUNTER — Ambulatory Visit (INDEPENDENT_AMBULATORY_CARE_PROVIDER_SITE_OTHER): Payer: Medicare Other | Admitting: Neurology

## 2020-07-27 VITALS — BP 116/70 | HR 62 | Ht 59.0 in | Wt 155.0 lb

## 2020-07-27 DIAGNOSIS — F29 Unspecified psychosis not due to a substance or known physiological condition: Secondary | ICD-10-CM

## 2020-07-27 DIAGNOSIS — G250 Essential tremor: Secondary | ICD-10-CM

## 2020-07-27 NOTE — Patient Instructions (Signed)

## 2020-08-02 DIAGNOSIS — J3081 Allergic rhinitis due to animal (cat) (dog) hair and dander: Secondary | ICD-10-CM | POA: Diagnosis not present

## 2020-08-02 DIAGNOSIS — J3089 Other allergic rhinitis: Secondary | ICD-10-CM | POA: Diagnosis not present

## 2020-08-02 DIAGNOSIS — J301 Allergic rhinitis due to pollen: Secondary | ICD-10-CM | POA: Diagnosis not present

## 2020-08-03 ENCOUNTER — Telehealth: Payer: Self-pay | Admitting: Neurology

## 2020-08-03 DIAGNOSIS — M8580 Other specified disorders of bone density and structure, unspecified site: Secondary | ICD-10-CM | POA: Diagnosis not present

## 2020-08-03 DIAGNOSIS — I10 Essential (primary) hypertension: Secondary | ICD-10-CM | POA: Diagnosis not present

## 2020-08-03 DIAGNOSIS — M1990 Unspecified osteoarthritis, unspecified site: Secondary | ICD-10-CM | POA: Diagnosis not present

## 2020-08-03 DIAGNOSIS — H409 Unspecified glaucoma: Secondary | ICD-10-CM | POA: Diagnosis not present

## 2020-08-03 NOTE — Telephone Encounter (Signed)
Spoke with clinical pharm who wondered if propranolol could be contributing to her psychosis.  I told him I doubt that was the case, but certainly had no objection to going off of it.  Told him that I thought she just had primary psychosis, unrelated to any neurologic condition.  Told him that I thought that needed to be treated in a primary care realm (clinical pharmacist was with the primary care physician), or within the realm of mental health.  Discussed that we only treat psychosis if it is associated with a neurologic condition, such as dementia.  Discussed that I have had to social workers work with the patient, as well as had APS work with this patient.w

## 2020-08-03 NOTE — Telephone Encounter (Signed)
-----   Message from [facility] sent at 08/03/2020  9:32 AM EDT ----- Regarding: Clinical Pharmacist wanting to speak with you Delorise Jackson, a clinical pharmacist with [facility], called in wanting to speak with you personally about this patient's medications at your earliest convenience. He gave me his personal phone number of [phone].  Thank you

## 2020-08-09 DIAGNOSIS — J3081 Allergic rhinitis due to animal (cat) (dog) hair and dander: Secondary | ICD-10-CM | POA: Diagnosis not present

## 2020-08-09 DIAGNOSIS — J301 Allergic rhinitis due to pollen: Secondary | ICD-10-CM | POA: Diagnosis not present

## 2020-08-09 DIAGNOSIS — J3089 Other allergic rhinitis: Secondary | ICD-10-CM | POA: Diagnosis not present

## 2020-08-10 ENCOUNTER — Telehealth: Payer: Self-pay | Admitting: Licensed Clinical Social Worker
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
## 2020-08-16 DIAGNOSIS — J301 Allergic rhinitis due to pollen: Secondary | ICD-10-CM | POA: Diagnosis not present

## 2020-08-16 DIAGNOSIS — J3089 Other allergic rhinitis: Secondary | ICD-10-CM | POA: Diagnosis not present

## 2020-08-16 DIAGNOSIS — J3081 Allergic rhinitis due to animal (cat) (dog) hair and dander: Secondary | ICD-10-CM | POA: Diagnosis not present

## 2020-08-18 ENCOUNTER — Encounter: Payer: Self-pay | Admitting: Cardiology

## 2020-08-18 ENCOUNTER — Ambulatory Visit: Payer: Medicare Other | Admitting: Cardiology

## 2020-08-18 ENCOUNTER — Other Ambulatory Visit: Payer: Self-pay

## 2020-08-18 ENCOUNTER — Ambulatory Visit (INDEPENDENT_AMBULATORY_CARE_PROVIDER_SITE_OTHER): Payer: Medicare Other | Admitting: Cardiology

## 2020-08-18 VITALS — BP 150/80 | HR 51 | Ht 59.0 in | Wt 152.6 lb

## 2020-08-18 DIAGNOSIS — I2510 Atherosclerotic heart disease of native coronary artery without angina pectoris: Secondary | ICD-10-CM | POA: Diagnosis not present

## 2020-08-18 DIAGNOSIS — G250 Essential tremor: Secondary | ICD-10-CM | POA: Diagnosis not present

## 2020-08-18 DIAGNOSIS — I10 Essential (primary) hypertension: Secondary | ICD-10-CM | POA: Diagnosis not present

## 2020-08-18 NOTE — Patient Instructions (Signed)
Medication Instructions:   Your physician recommends that you continue on your current medications as directed. Please refer to the Current Medication list given to you today.  *If you need a refill on your cardiac medications before your next appointment, please call your pharmacy*   Follow-Up: At [facility], you and your health needs are our priority.  As part of our continuing mission to provide you with exceptional heart care, we have created designated Provider Care Teams.  These Care Teams include your primary Cardiologist (physician) and Advanced Practice Providers (APPs -  Physician Assistants and Nurse Practitioners) who all work together to provide you with the care you need, when you need it.  We recommend signing up for the patient portal called "MyChart".  Sign up information is provided on this After Visit Summary.  MyChart is used to connect with patients for Virtual Visits (Telemedicine).  Patients are able to view lab/test results, encounter notes, upcoming appointments, etc.  Non-urgent messages can be sent to your provider as well.   To learn more about what you can do with MyChart, go to [email or web address].    Your next appointment:   6 month(s)  The format for your next appointment:   In Person  Provider:   You will see one of the following Advanced Practice Providers on your designated Care Team:    Richardson Dopp, PA-C  [place], PA-C  JILL [facility] NP  DAYNA DUNN PA-C  MICHELE LENZE PA-C  Cecilie Kicks NP

## 2020-08-23 DIAGNOSIS — J3089 Other allergic rhinitis: Secondary | ICD-10-CM | POA: Diagnosis not present

## 2020-08-23 DIAGNOSIS — J301 Allergic rhinitis due to pollen: Secondary | ICD-10-CM | POA: Diagnosis not present

## 2020-08-23 DIAGNOSIS — J3081 Allergic rhinitis due to animal (cat) (dog) hair and dander: Secondary | ICD-10-CM | POA: Diagnosis not present

## 2020-08-30 DIAGNOSIS — J3089 Other allergic rhinitis: Secondary | ICD-10-CM | POA: Diagnosis not present

## 2020-08-30 DIAGNOSIS — J301 Allergic rhinitis due to pollen: Secondary | ICD-10-CM | POA: Diagnosis not present

## 2020-08-30 DIAGNOSIS — J3081 Allergic rhinitis due to animal (cat) (dog) hair and dander: Secondary | ICD-10-CM | POA: Diagnosis not present

## 2020-08-31 ENCOUNTER — Ambulatory Visit (INDEPENDENT_AMBULATORY_CARE_PROVIDER_SITE_OTHER): Payer: Medicare Other | Admitting: Counselor

## 2020-08-31 ENCOUNTER — Other Ambulatory Visit: Payer: Self-pay

## 2020-08-31 ENCOUNTER — Ambulatory Visit: Payer: Medicare Other | Admitting: Psychology

## 2020-08-31 DIAGNOSIS — R4689 Other symptoms and signs involving appearance and behavior: Secondary | ICD-10-CM | POA: Diagnosis not present

## 2020-08-31 DIAGNOSIS — F09 Unspecified mental disorder due to known physiological condition: Secondary | ICD-10-CM

## 2020-08-31 DIAGNOSIS — R4189 Other symptoms and signs involving cognitive functions and awareness: Secondary | ICD-10-CM | POA: Diagnosis not present

## 2020-08-31 DIAGNOSIS — F22 Delusional disorders: Secondary | ICD-10-CM
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
## 2020-08-31 NOTE — Progress Notes (Addendum)
   Psychometrist Note   Cognitive testing was administered to Annette Mathews by  , B.S. (Technician) under the supervision of Peter Stewart, Psy.D., ABN. Ms. Lonzo discontinued testing after only a brief period of time. Dr. Stewart met with the patient and attempted to discuss any concerns with her to elicit her compliance, but she did not wish to proceed and testing was discontinued at that time.   The battery of tests administered was selected by Dr. Stewart with consideration to the patient's current level of functioning, the nature of her symptoms, emotional and behavioral responses during the interview, level of literacy, observed level of motivation/effort, and the nature of the referral question. This battery was communicated to the psychometrist. Communication between Dr. Stewart and the psychometrist was ongoing throughout the evaluation and Dr. Stewart was immediately accessible at all times. Dr. Stewart provided supervision to the technician on the date of this service, to the extent necessary to assure the quality of all services provided.    Annette Mathews will return in approximately one week for an interactive feedback session with Dr. Stewart, at which time test performance, clinical impressions, and treatment recommendations will be reviewed in detail. The patient understands she can contact our office should she require our assistance before this time.   A total of 50 minutes of billable time were spent with Annette Mathews by the technician, including test administration and scoring time. Billing for these services is reflected in Dr. Stewart's note.   This note reflects time spent with the psychometrician and does not include test scores, clinical history, or any interpretations made by Dr. Stewart. The full report will follow in a separate note. 

## 2020-09-01 ENCOUNTER — Encounter: Payer: Self-pay | Admitting: Counselor

## 2020-09-06 DIAGNOSIS — J301 Allergic rhinitis due to pollen: Secondary | ICD-10-CM | POA: Diagnosis not present

## 2020-09-06 DIAGNOSIS — J3089 Other allergic rhinitis: Secondary | ICD-10-CM | POA: Diagnosis not present

## 2020-09-06 DIAGNOSIS — J3081 Allergic rhinitis due to animal (cat) (dog) hair and dander: Secondary | ICD-10-CM | POA: Diagnosis not present

## 2020-09-13 ENCOUNTER — Encounter: Payer: Medicare Other | Admitting: Counselor

## 2020-09-15 ENCOUNTER — Encounter: Payer: Self-pay | Admitting: Counselor

## 2020-09-15 ENCOUNTER — Ambulatory Visit (INDEPENDENT_AMBULATORY_CARE_PROVIDER_SITE_OTHER): Payer: Medicare Other | Admitting: Counselor

## 2020-09-15 ENCOUNTER — Other Ambulatory Visit: Payer: Self-pay

## 2020-09-15 DIAGNOSIS — R413 Other amnesia: Secondary | ICD-10-CM | POA: Diagnosis not present

## 2020-09-15 DIAGNOSIS — F22 Delusional disorders: Secondary | ICD-10-CM | POA: Diagnosis not present

## 2020-09-15 NOTE — Patient Instructions (Signed)
We discussed your presentation and performance on neuropsychological testing. You did not wish to complete much testing, so I don't have a neuropsychological opinion. I did share my opinion with you that you likely have a neurodegenerative illness.   We talked about the differences between mild cognitive impairment, dementia, and degenerative illness. I was candid with you that sometimes, dementia starts with no cognitive symptoms and instead involves primarily psychiatric symptoms. This is often delusions or hallucinations. I was also candid with you that I think this is the most likely cause of your problem.   If you wanted to definitively find out what your dementia is due to, my recommendation would be that you consult with a behavioral neurologist. Given your demographics, Alzheimer's disease is the most likely cause of your problem, and that could be ascertained with a CSF puncture for Alzheimer's disease biomarkers.   We discussed the fact that if your behavior is substantially influenced by hallucinations and delusions, it can make it challenging to make decisions.   Decision making is a complex ability and it is challenging to evaluate. It is generally accepted that there are four primary components of decision making including: 1). The ability to consistently express a preference 2). The ability to understand information pertinent to the decision at hand, including risks and benefits.  3). The ability to reason with that information in accordance with ones own values and circumstances 4). Appreciation for the consequences of the decision as applied to an individuals own unique circumstances.   If your medical providers feel that any of these abilities are impaired, then the cause does not necessarily need to be known.

## 2020-09-15 NOTE — Progress Notes (Signed)
   NEUROPSYCHOLOGY FEEDBACK NOTE [facility]  Feedback Note: I met with Annette Mathews to review the findings resulting from her neuropsychological evaluation. Since the last appointment, she has been about the same. I atempted to motivate her to complete a MoCA, because she did well on the MMSE last time and did not wish to do much testing, but she did not want to. She is too preoccupied by her concerns with the delusion she is experiencing. Time was spent reviewing the impressions and recommendations that are detailed in the evaluation report. I was candid with her that I think she probably has a neurodegenerative condition, and provided education on the difference between MCI, dementia, and neurodegeneration. Unfortunately, she seems to have very significant anosognosia and is unlikely to accept help, my sense is this will eventually become a guardianship issue. I counseled them on further workup that could be pursued that would definitively answer the questions as to whether this is due to neurodegeneration, although it may not matter from a management perspective. I took time to explain the findings and answer all the patient's questions. I encouraged Annette Mathews to contact me should she have any further questions or if further follow up is desired.   Current Medications and Medical History   Current Outpatient Medications  Medication Sig Dispense Refill   ALPRAZolam (XANAX) 0.5 MG tablet Take 0.5 mg by mouth 2 (two) times daily as needed.      AMOXICILLIN PO Take 2,000 mg by mouth. Every 6 months when going to dentist     gabapentin (NEURONTIN) 100 MG capsule Take 100 mg by mouth 3 (three) times daily.     hydrochlorothiazide (HYDRODIURIL) 25 MG tablet Take 25 mg by mouth as needed.  1   ipratropium (ATROVENT) 0.06 % nasal spray 2 drops in each nostril as needed     latanoprost (XALATAN) 0.005 % ophthalmic solution 1 drop at bedtime. 1 drop both eyes daily     losartan (COZAAR) 100 MG  tablet Take 100 mg by mouth daily.  1   PROPRANOLOL HCL PO Take 80 mg by mouth. 2 in the AM, 1 at night     No current facility-administered medications for this visit.    There are no problems to display for this patient.   Mental Status and Behavioral Observations  Annette Mathews presented on time to the present encounter and was alert and generally oriented. Speech was normal in rate, rhythm, volume, and prosody. Self-reported mood was "very very stressed" and affect was irritable with moments of anger. Thought process was fairly goal-directed albeit with some stickiness and thought content was focused on delusional content. There were no safety concerns identified at today's encounter, such as thoughts of harming self or others.   Plan  Feedback provided regarding the patient's neuropsychological evaluation. She is unlikely to accept help and my sense is that this will eventually become a guardianship issue. Her behavior is substantially influenced by delusions and hallucinations and she has significant anosognosia. Annette Mathews was encouraged to contact me if any questions arise or if further follow up is desired.   Viviano Simas Nicole Kindred, PsyD, ABN Clinical Neuropsychologist  Service(s) Provided at This Encounter: 28 minutes [phone]; Psychotherapy with patient/family)

## 2020-09-23 DIAGNOSIS — H401132 Primary open-angle glaucoma, bilateral, moderate stage: Secondary | ICD-10-CM | POA: Diagnosis not present

## 2020-09-27 ENCOUNTER — Encounter: Payer: Medicare Other | Admitting: Counselor

## 2020-09-27 DIAGNOSIS — J3089 Other allergic rhinitis: Secondary | ICD-10-CM | POA: Diagnosis not present

## 2020-09-27 DIAGNOSIS — J3081 Allergic rhinitis due to animal (cat) (dog) hair and dander: Secondary | ICD-10-CM | POA: Diagnosis not present

## 2020-09-27 DIAGNOSIS — J301 Allergic rhinitis due to pollen: Secondary | ICD-10-CM | POA: Diagnosis not present

## 2020-09-29 DIAGNOSIS — M1990 Unspecified osteoarthritis, unspecified site: Secondary | ICD-10-CM | POA: Diagnosis not present

## 2020-09-29 DIAGNOSIS — I10 Essential (primary) hypertension: Secondary | ICD-10-CM | POA: Diagnosis not present

## 2020-09-29 DIAGNOSIS — H409 Unspecified glaucoma: Secondary | ICD-10-CM | POA: Diagnosis not present

## 2020-09-29 DIAGNOSIS — M8580 Other specified disorders of bone density and structure, unspecified site: Secondary | ICD-10-CM | POA: Diagnosis not present

## 2020-10-05 DIAGNOSIS — M25532 Pain in left wrist: Secondary | ICD-10-CM | POA: Diagnosis not present

## 2020-10-05 DIAGNOSIS — J301 Allergic rhinitis due to pollen: Secondary | ICD-10-CM | POA: Diagnosis not present

## 2020-10-05 DIAGNOSIS — J3089 Other allergic rhinitis: Secondary | ICD-10-CM | POA: Diagnosis not present

## 2020-10-05 DIAGNOSIS — J3081 Allergic rhinitis due to animal (cat) (dog) hair and dander: Secondary | ICD-10-CM | POA: Diagnosis not present

## 2020-10-06 DIAGNOSIS — M25532 Pain in left wrist: Secondary | ICD-10-CM | POA: Diagnosis not present

## 2020-10-11 DIAGNOSIS — J301 Allergic rhinitis due to pollen: Secondary | ICD-10-CM | POA: Diagnosis not present

## 2020-10-11 DIAGNOSIS — J3089 Other allergic rhinitis: Secondary | ICD-10-CM | POA: Diagnosis not present

## 2020-10-11 DIAGNOSIS — J3081 Allergic rhinitis due to animal (cat) (dog) hair and dander: Secondary | ICD-10-CM | POA: Diagnosis not present

## 2020-10-14 DIAGNOSIS — Z23 Encounter for immunization: Secondary | ICD-10-CM | POA: Diagnosis not present

## 2020-10-17 DIAGNOSIS — I10 Essential (primary) hypertension: Secondary | ICD-10-CM | POA: Diagnosis not present

## 2020-10-17 DIAGNOSIS — G250 Essential tremor: Secondary | ICD-10-CM | POA: Diagnosis not present

## 2020-10-18 DIAGNOSIS — J3081 Allergic rhinitis due to animal (cat) (dog) hair and dander: Secondary | ICD-10-CM | POA: Diagnosis not present

## 2020-10-18 DIAGNOSIS — J3089 Other allergic rhinitis: Secondary | ICD-10-CM | POA: Diagnosis not present

## 2020-10-18 DIAGNOSIS — J301 Allergic rhinitis due to pollen: Secondary | ICD-10-CM | POA: Diagnosis not present

## 2020-10-25 DIAGNOSIS — J3089 Other allergic rhinitis: Secondary | ICD-10-CM | POA: Diagnosis not present

## 2020-10-25 DIAGNOSIS — J3081 Allergic rhinitis due to animal (cat) (dog) hair and dander: Secondary | ICD-10-CM | POA: Diagnosis not present

## 2020-10-25 DIAGNOSIS — J301 Allergic rhinitis due to pollen: Secondary | ICD-10-CM | POA: Diagnosis not present

## 2020-11-01 DIAGNOSIS — J3089 Other allergic rhinitis: Secondary | ICD-10-CM | POA: Diagnosis not present

## 2020-11-01 DIAGNOSIS — J3081 Allergic rhinitis due to animal (cat) (dog) hair and dander: Secondary | ICD-10-CM | POA: Diagnosis not present

## 2020-11-01 DIAGNOSIS — J301 Allergic rhinitis due to pollen: Secondary | ICD-10-CM | POA: Diagnosis not present

## 2020-11-02 ENCOUNTER — Emergency Department (HOSPITAL_COMMUNITY)
Admission: EM | Admit: 2020-11-02 | Discharge: 2020-11-03 | Disposition: A | Discharge status: Other Acute Inpatient Hospital | Payer: Medicare Other | Attending: Emergency Medicine | Admitting: Emergency Medicine

## 2020-11-02 ENCOUNTER — Encounter (HOSPITAL_COMMUNITY): Payer: Self-pay | Admitting: Emergency Medicine

## 2020-11-02 ENCOUNTER — Other Ambulatory Visit: Payer: Self-pay

## 2020-11-02 ENCOUNTER — Ambulatory Visit (INDEPENDENT_AMBULATORY_CARE_PROVIDER_SITE_OTHER)
Admission: EM | Admit: 2020-11-02 | Discharge: 2020-11-02 | Disposition: A | Discharge status: Other Acute Inpatient Hospital | Payer: Medicare Other | Source: Home / Self Care

## 2020-11-02 DIAGNOSIS — I10 Essential (primary) hypertension: Secondary | ICD-10-CM | POA: Diagnosis not present

## 2020-11-02 DIAGNOSIS — Y900 Blood alcohol level of less than 20 mg/100 ml: Secondary | ICD-10-CM | POA: Insufficient documentation

## 2020-11-02 DIAGNOSIS — F22 Delusional disorders: Secondary | ICD-10-CM

## 2020-11-02 DIAGNOSIS — Z20822 Contact with and (suspected) exposure to covid-19: Secondary | ICD-10-CM | POA: Insufficient documentation

## 2020-11-02 DIAGNOSIS — Z046 Encounter for general psychiatric examination, requested by authority: Secondary | ICD-10-CM | POA: Diagnosis present

## 2020-11-02 DIAGNOSIS — F29 Unspecified psychosis not due to a substance or known physiological condition: Secondary | ICD-10-CM | POA: Diagnosis not present

## 2020-11-02 DIAGNOSIS — Z79899 Other long term (current) drug therapy: Secondary | ICD-10-CM | POA: Diagnosis not present

## 2020-11-02 DIAGNOSIS — G250 Essential tremor: Secondary | ICD-10-CM | POA: Insufficient documentation

## 2020-11-02 LAB — CBC
HCT: 44.4 % (ref 36.0–46.0)
Hemoglobin: 14.2 g/dL (ref 12.0–15.0)
MCH: 27.8 pg (ref 26.0–34.0)
MCHC: 32 g/dL (ref 30.0–36.0)
MCV: 87.1 fL (ref 80.0–100.0)
Platelets: 251 10*3/uL (ref 150–400)
RBC: 5.1 MIL/uL (ref 3.87–5.11)
RDW: 14.9 % (ref 11.5–15.5)
WBC: 6.7 10*3/uL (ref 4.0–10.5)
nRBC: 0 % (ref 0.0–0.2)

## 2020-11-02 LAB — COMPREHENSIVE METABOLIC PANEL
ALT: 17 U/L (ref 0–44)
AST: 21 U/L (ref 15–41)
Albumin: 4 g/dL (ref 3.5–5.0)
Alkaline Phosphatase: 95 U/L (ref 38–126)
Anion gap: 9 (ref 5–15)
BUN: 16 mg/dL (ref 8–23)
CO2: 25 mmol/L (ref 22–32)
Calcium: 9.6 mg/dL (ref 8.9–10.3)
Chloride: 106 mmol/L (ref 98–111)
Creatinine, Ser: 0.57 mg/dL (ref 0.44–1.00)
GFR, Estimated: >60 mL/min (ref >60)
Glucose, Bld: 95 mg/dL (ref 70–99)
Potassium: 3.5 mmol/L (ref 3.5–5.1)
Sodium: 140 mmol/L (ref 135–145)
Total Bilirubin: 0.9 mg/dL (ref 0.3–1.2)
Total Protein: 7.2 g/dL (ref 6.5–8.1)

## 2020-11-02 LAB — RAPID URINE DRUG SCREEN, HOSP PERFORMED
Amphetamines: NOT DETECTED
Barbiturates: NOT DETECTED
Benzodiazepines: NOT DETECTED
Cocaine: NOT DETECTED
Opiates: NOT DETECTED
Tetrahydrocannabinol: NOT DETECTED

## 2020-11-02 LAB — ACETAMINOPHEN LEVEL: Acetaminophen (Tylenol), Serum: <10 ug/mL — ABNORMAL LOW (ref 10–30)

## 2020-11-02 LAB — ETHANOL: Alcohol, Ethyl (B): <10 mg/dL (ref <10)

## 2020-11-02 LAB — SALICYLATE LEVEL: Salicylate Lvl: <7 mg/dL — ABNORMAL LOW (ref 7.0–30.0)

## 2020-11-02 MED ORDER — ZIPRASIDONE MESYLATE 20 MG IM SOLR: 10.0000 mg | INTRAMUSCULAR | Status: DC | PRN

## 2020-11-02 MED ORDER — LOSARTAN POTASSIUM 50 MG PO TABS
100.0000 mg | ORAL_TABLET | Freq: Every day | ORAL | Status: DC
  Administered 2020-11-02 – 2020-11-03 (×2): 100 mg via ORAL
  Filled 2020-11-02 (×2): qty 2

## 2020-11-02 MED ORDER — PROPRANOLOL HCL 40 MG PO TABS
40.0000 mg | ORAL_TABLET | Freq: Every day | ORAL | Status: DC
  Administered 2020-11-02: 40 mg via ORAL
  Filled 2020-11-02: qty 1

## 2020-11-02 MED ORDER — PROPRANOLOL HCL 1 MG/ML IV SOLN: 1.0000 mg | Freq: Once | INTRAVENOUS | Status: DC

## 2020-11-02 MED ORDER — IPRATROPIUM BROMIDE 0.06 % NA SOLN
1.0000 | Freq: Every day | NASAL | Status: DC
  Administered 2020-11-02 – 2020-11-03 (×2): 1 via NASAL
  Filled 2020-11-02: qty 15

## 2020-11-02 MED ORDER — RISPERIDONE 2 MG PO TBDP: 2.0000 mg | ORAL_TABLET | Freq: Three times a day (TID) | ORAL | Status: DC | PRN

## 2020-11-02 MED ORDER — GABAPENTIN 100 MG PO CAPS
100.0000 mg | ORAL_CAPSULE | Freq: Three times a day (TID) | ORAL | Status: DC
  Administered 2020-11-02 – 2020-11-03 (×2): 100 mg via ORAL
  Filled 2020-11-02 (×2): qty 1

## 2020-11-02 MED ORDER — HYDROCHLOROTHIAZIDE 25 MG PO TABS
25.0000 mg | ORAL_TABLET | Freq: Every day | ORAL | Status: DC
  Filled 2020-11-02 (×2): qty 1

## 2020-11-02 MED ORDER — LORAZEPAM 0.5 MG PO TABS
0.5000 mg | ORAL_TABLET | ORAL | Status: AC | PRN
  Administered 2020-11-02: 0.5 mg via ORAL
  Filled 2020-11-02: qty 1

## 2020-11-02 MED ORDER — LATANOPROST 0.005 % OP SOLN
1.0000 [drp] | Freq: Every day | OPHTHALMIC | Status: DC
  Administered 2020-11-02: 1 [drp] via OPHTHALMIC
  Filled 2020-11-02: qty 2.5

## 2020-11-02 MED ORDER — LOSARTAN POTASSIUM 50 MG PO TABS: 100.0000 mg | ORAL_TABLET | Freq: Once | ORAL | Status: DC

## 2020-11-02 MED ORDER — PROPRANOLOL HCL 40 MG PO TABS
80.0000 mg | ORAL_TABLET | Freq: Every morning | ORAL | Status: DC
  Administered 2020-11-03: 80 mg via ORAL
  Filled 2020-11-02: qty 2

## 2020-11-02 MED ORDER — HYDROCHLOROTHIAZIDE 25 MG PO TABS: 25.0000 mg | ORAL_TABLET | Freq: Once | ORAL | Status: DC

## 2020-11-02 NOTE — BH Assessment (Addendum)
Disposition:   Pt was seen at [facility], transferred to ED under IVC-- needs placement. Patient referred to several appropriate facilities for consideration of bed placement.   [facility]  Pending - Request Sent N/A [address], [place] [address] [phone] --  [address] [place] [address] [phone] --  [facility]  Pending - Request Sent N/A [address] [place] [id number] --  [facility]  Pending - Request Sent N/A [address] [place] [address] [phone] --  [facility]  Pending - Request Sent N/A [place], [place] [address] [phone] --  [facility]  Pending - Request Sent N/A [address] [place], [place] [address] [phone] --  [facility]  Pending - Request Sent N/A [address] [place] [address] [phone] --  [facility]  Pending - Request Sent N/A [address], [place] [address] [id number] --  [facility]  Pending - Request Sent N/A [id number] [address] [place] [address] [phone] --  [place] N/A [address], [place] [address] [phone] --  [place]  Pending - Request Sent N/A [address] [place] [address] [phone] --  [facility]  Pending - Request Sent N/A [place], [place] [address] [id number] --  [facility]  Pending - Request Sent N/A [address], [place] [address] [phone] --  [facility]  Pending - Request Sent N/A [address] [place] [address] [phone] --  [facility]  Pending - Request Sent N/A [address] [place] [address] [phone] --  [facility]  Pending - Request Sent N/A [address] [place] [address] [id number] [phone] --  [facility]  Pending - Request Sent N/A [address], [place] [address] [phone] --  [place] N/A [place], [place] [address] [phone] --  [facility]  Pending - Request Sent N/A [address] [place] [address] [phone] --  [place] [address] [place] [address] [phone] --  [facility]  Pending - Request Sent N/A [address] [place] [address] [phone] --  [place] N/A [place], [place] [address] [phone] --  [place] N/
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
## 2020-11-02 NOTE — BHH Counselor (Signed)
This counselor has completed First Examination and placed it Affidavit and Petition for transfer to ED.

## 2020-11-02 NOTE — BH Assessment (Addendum)
Disposition:   Per, Tammy, patient accepted to [facility] for admission 11/03/2020 (after 8am). The accepting provider is Dr. Gerrit Halls. Nurse report [phone]. Patients nurse Kasandra Knudsen, RN, provided disposition updates
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
## 2020-11-02 NOTE — Discharge Instructions (Addendum)
Patient will be sent to [facility] ED for medical clearance and placement.Talked to Dr. Shirlyn Goltz and he has agreed to accept the patient.  Patient will be transported via safe transportation
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
## 2020-11-02 NOTE — ED Notes (Signed)
ED Provider at bedside. 
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
## 2020-11-02 NOTE — ED Provider Notes (Signed)
[facility] EMERGENCY DEPARTMENT Provider Note   CSN: [id number] Arrival date & time: 11/02/20  1749     History No chief complaint on file.   Annette Mathews is a 75 y.o. female.  HPI 75 year old female under IVC sent from behind for medical clearance.  Per their report patient is delusional, paranoid, psychotic, and not sleeping well.  She has made threats to kill people in her neighborhood.  Medical complaints were noted.  However, she does have a history of hypertension and had not taken her antihypertensives.     Past Medical History:  Diagnosis Date   Anxiety    Glaucoma    Hypertension     There are no problems to display for this patient.   Past Surgical History:  Procedure Laterality Date   FOOT SURGERY     multiple   Hysterectomy     KNEE SURGERY Bilateral    TKA     OB History   No obstetric history on file.     Family History  Problem Relation Age of Onset   COPD Father    COPD Brother    Tremor Brother    Breast cancer Neg Hx     Social History   Tobacco Use   Smoking status: Never   Smokeless tobacco: Never  Vaping Use   Vaping Use: Never used  Substance Use Topics   Alcohol use: Yes    Comment: occas.   Drug use: No    Home Medications Prior to Admission medications   Medication Sig Start Date End Date Taking? Authorizing Provider  ALPRAZolam Duanne Moron) 0.5 MG tablet Take 0.5 mg by mouth 2 (two) times daily as needed.  12/11/13   [provider]  AMOXICILLIN PO Take 2,000 mg by mouth. Every 6 months when going to dentist    [provider]  gabapentin (NEURONTIN) 100 MG capsule Take 100 mg by mouth 3 (three) times daily.    [provider]  hydrochlorothiazide (HYDRODIURIL) 25 MG tablet Take 25 mg by mouth as needed. 09/23/17   [provider]  ipratropium (ATROVENT) 0.06 % nasal spray 2 drops in each nostril as needed    [provider]  latanoprost (XALATAN) 0.005 %  ophthalmic solution 1 drop at bedtime. 1 drop both eyes daily    [provider]  losartan (COZAAR) 100 MG tablet Take 100 mg by mouth daily. 10/31/17   [provider]  PROPRANOLOL HCL PO Take 80 mg by mouth. 2 in the AM, 1 at night    [provider]    Allergies    Montelukast, Sertraline, Tramadol, Codeine, Erythromycin, Hydrocodone-acetaminophen, Iodinated diagnostic agents, Iodine-131, Meperidine, Montelukast sodium, and Morphine sulfate  Review of Systems   Review of Systems  All other systems reviewed and are negative.  Physical Exam Updated Vital Signs BP (!) 176/94 (BP Location: Left Arm)   Pulse 83   Temp 97.8 F (36.6 C) (Oral)   Resp 18   SpO2 96%   Physical Exam Vitals and nursing note reviewed.  Constitutional:      General: She is not in acute distress.    Appearance: She is well-developed.  HENT:     Head: Normocephalic and atraumatic.     Right Ear: External ear normal.     Left Ear: External ear normal.     Nose: Nose normal.  Eyes:     Conjunctiva/sclera: Conjunctivae normal.     Pupils: Pupils are equal, round, and reactive  to light.  Pulmonary:     Effort: Pulmonary effort is normal.  Musculoskeletal:        General: Normal range of motion.     Cervical back: Normal range of motion and neck supple.  Skin:    General: Skin is warm and dry.  Neurological:     Mental Status: She is alert and oriented to person, place, and time.     Motor: No abnormal muscle tone.     Coordination: Coordination normal.  Psychiatric:        Behavior: Behavior normal.        Thought Content: Thought content normal.    ED Results / Procedures / Treatments   Labs (all labs ordered are listed, but only abnormal results are displayed) Labs Reviewed  SALICYLATE LEVEL - Abnormal; Notable for the following components:      Result Value   Salicylate Lvl [id number] (*)    All other components within normal limits  ACETAMINOPHEN LEVEL - Abnormal;  Notable for the following components:   Acetaminophen (Tylenol), Serum <10 (*)    All other components within normal limits  RESP PANEL BY RT-PCR (FLU A&B, COVID) ARPGX2  COMPREHENSIVE METABOLIC PANEL  ETHANOL  CBC  RAPID URINE DRUG SCREEN, HOSP PERFORMED    EKG None  Radiology No results found.  Procedures Procedures   Medications Ordered in ED Medications  hydrochlorothiazide (HYDRODIURIL) tablet 25 mg (has no administration in time range)  losartan (COZAAR) tablet 100 mg (has no administration in time range)    ED Course  I have reviewed the triage vital signs and the nursing notes.  Pertinent labs & imaging results that were available during my care of the patient were reviewed by me and considered in my medical decision making (see chart for details).    MDM Rules/Calculators/A&P                          Labs obtained here and appear to be within normal limits.  Blood pressure is decreasing after medications.  She continues to remain psychotic, paranoid, delusional.  Plan to place psych hold orders.  Final Clinical Impression(s) / ED Diagnoses Final diagnoses:  Psychosis, unspecified psychosis type [facility])    Rx / [place] Orders ED Discharge Orders     None        Pattricia Boss, MD 11/02/20 2141

## 2020-11-03 DIAGNOSIS — H409 Unspecified glaucoma: Secondary | ICD-10-CM | POA: Diagnosis present

## 2020-11-03 DIAGNOSIS — R251 Tremor, unspecified: Secondary | ICD-10-CM | POA: Diagnosis not present

## 2020-11-03 DIAGNOSIS — M069 Rheumatoid arthritis, unspecified: Secondary | ICD-10-CM | POA: Diagnosis present

## 2020-11-03 DIAGNOSIS — F0390 Unspecified dementia without behavioral disturbance: Secondary | ICD-10-CM | POA: Diagnosis not present

## 2020-11-03 DIAGNOSIS — Z20822 Contact with and (suspected) exposure to covid-19: Secondary | ICD-10-CM | POA: Diagnosis not present

## 2020-11-03 DIAGNOSIS — E119 Type 2 diabetes mellitus without complications: Secondary | ICD-10-CM | POA: Diagnosis not present

## 2020-11-03 DIAGNOSIS — J309 Allergic rhinitis, unspecified: Secondary | ICD-10-CM | POA: Diagnosis present

## 2020-11-03 DIAGNOSIS — R0981 Nasal congestion: Secondary | ICD-10-CM | POA: Diagnosis not present

## 2020-11-03 DIAGNOSIS — Z79899 Other long term (current) drug therapy: Secondary | ICD-10-CM | POA: Diagnosis not present

## 2020-11-03 DIAGNOSIS — E559 Vitamin D deficiency, unspecified: Secondary | ICD-10-CM | POA: Diagnosis not present

## 2020-11-03 DIAGNOSIS — F29 Unspecified psychosis not due to a substance or known physiological condition: Secondary | ICD-10-CM | POA: Diagnosis present

## 2020-11-03 DIAGNOSIS — E860 Dehydration: Secondary | ICD-10-CM | POA: Diagnosis not present

## 2020-11-03 DIAGNOSIS — G252 Other specified forms of tremor: Secondary | ICD-10-CM | POA: Diagnosis present

## 2020-11-03 DIAGNOSIS — I10 Essential (primary) hypertension: Secondary | ICD-10-CM | POA: Diagnosis present

## 2020-11-03 DIAGNOSIS — D519 Vitamin B12 deficiency anemia, unspecified: Secondary | ICD-10-CM | POA: Diagnosis not present

## 2020-11-03 DIAGNOSIS — Z712 Person consulting for explanation of examination or test findings: Secondary | ICD-10-CM | POA: Diagnosis not present

## 2020-11-03 DIAGNOSIS — K5909 Other constipation: Secondary | ICD-10-CM | POA: Diagnosis not present

## 2020-11-03 DIAGNOSIS — J302 Other seasonal allergic rhinitis: Secondary | ICD-10-CM | POA: Diagnosis not present

## 2020-11-03 LAB — RESP PANEL BY RT-PCR (FLU A&B, COVID) ARPGX2
Influenza A by PCR: NEGATIVE
Influenza B by PCR: NEGATIVE
SARS Coronavirus 2 by RT PCR: NEGATIVE

## 2020-11-03 NOTE — ED Notes (Signed)
Fresh clothes, paper scrubs, socks, undergarments, slides given. Up to b/r, steady gait. Labs explained per request, valuables, meds and belongings signed for, [facility] arrival imminent.

## 2020-11-03 NOTE — ED Notes (Addendum)
Pt called/speaking with friend Ivan Anchors by phone, letting her know of plan, and her concern for her car, house, belongings while she is gone for 2 weeks. Second call to a Shirlean Mylar, Therapist, sports.  Pending arrival of [facility]. Pending transfer to [facility], [place], [place]. Belongings and valuables present. IVC present. Report called.

## 2020-11-03 NOTE — ED Notes (Signed)
Showering at this time.

## 2020-11-03 NOTE — ED Provider Notes (Signed)
  Physical Exam  BP (!) 185/96 (BP Location: Left Arm)   Pulse 69   Temp 98.1 F (36.7 C) (Oral)   Resp 16   SpO2 95%   Physical Exam  ED Course/Procedures     Procedures  MDM  Received care of patient from previous providers.  Please see their notes for prior history, physical and care.  Briefly this is a 75 year old female who was placed under IVC by her counselor due to delusional, paranoid, psychotic behavior with making threats to kill people in her neighborhood.  She has been accepted to [facility].  She is upset regarding the transfer, but is stable and medically cleared for transfer for continued care.       Gareth Morgan, MD 11/03/20 [phone]

## 2020-11-07 ENCOUNTER — Telehealth: Payer: Self-pay | Admitting: Neurology

## 2020-11-07 NOTE — Telephone Encounter (Signed)
Noted and glad to hear this.

## 2020-11-07 NOTE — Telephone Encounter (Signed)
Patient's daughter, Maudie Mercury (no DPR on file to share [place]), called and left a message stating the patient was involuntarily recently admitted to [facility] in [place], [place] only, she said she was aware she was not on the [facility].

## 2020-11-18 DIAGNOSIS — I10 Essential (primary) hypertension: Secondary | ICD-10-CM | POA: Diagnosis not present

## 2020-11-18 DIAGNOSIS — G250 Essential tremor: Secondary | ICD-10-CM | POA: Diagnosis not present

## 2020-11-18 DIAGNOSIS — F411 Generalized anxiety disorder: Secondary | ICD-10-CM | POA: Diagnosis not present

## 2020-11-22 DIAGNOSIS — J3081 Allergic rhinitis due to animal (cat) (dog) hair and dander: Secondary | ICD-10-CM | POA: Diagnosis not present

## 2020-11-22 DIAGNOSIS — J3089 Other allergic rhinitis: Secondary | ICD-10-CM | POA: Diagnosis not present

## 2020-11-22 DIAGNOSIS — J301 Allergic rhinitis due to pollen: Secondary | ICD-10-CM | POA: Diagnosis not present

## 2020-11-29 DIAGNOSIS — J3081 Allergic rhinitis due to animal (cat) (dog) hair and dander: Secondary | ICD-10-CM | POA: Diagnosis not present

## 2020-11-29 DIAGNOSIS — J301 Allergic rhinitis due to pollen: Secondary | ICD-10-CM | POA: Diagnosis not present

## 2020-11-29 DIAGNOSIS — J3089 Other allergic rhinitis: Secondary | ICD-10-CM | POA: Diagnosis not present

## 2020-12-02 DIAGNOSIS — J3089 Other allergic rhinitis: Secondary | ICD-10-CM | POA: Diagnosis not present

## 2020-12-02 DIAGNOSIS — J3081 Allergic rhinitis due to animal (cat) (dog) hair and dander: Secondary | ICD-10-CM | POA: Diagnosis not present

## 2020-12-02 DIAGNOSIS — J301 Allergic rhinitis due to pollen: Secondary | ICD-10-CM | POA: Diagnosis not present

## 2020-12-06 DIAGNOSIS — J3081 Allergic rhinitis due to animal (cat) (dog) hair and dander: Secondary | ICD-10-CM | POA: Diagnosis not present

## 2020-12-06 DIAGNOSIS — J3089 Other allergic rhinitis: Secondary | ICD-10-CM | POA: Diagnosis not present

## 2020-12-06 DIAGNOSIS — J301 Allergic rhinitis due to pollen: Secondary | ICD-10-CM | POA: Diagnosis not present

## 2020-12-13 DIAGNOSIS — J301 Allergic rhinitis due to pollen: Secondary | ICD-10-CM | POA: Diagnosis not present

## 2020-12-13 DIAGNOSIS — J3089 Other allergic rhinitis: Secondary | ICD-10-CM | POA: Diagnosis not present

## 2020-12-13 DIAGNOSIS — J3081 Allergic rhinitis due to animal (cat) (dog) hair and dander: Secondary | ICD-10-CM | POA: Diagnosis not present

## 2020-12-13 DIAGNOSIS — H401132 Primary open-angle glaucoma, bilateral, moderate stage: Secondary | ICD-10-CM | POA: Diagnosis not present

## 2020-12-20 DIAGNOSIS — J301 Allergic rhinitis due to pollen: Secondary | ICD-10-CM | POA: Diagnosis not present

## 2020-12-20 DIAGNOSIS — J3089 Other allergic rhinitis: Secondary | ICD-10-CM | POA: Diagnosis not present

## 2020-12-20 DIAGNOSIS — J3081 Allergic rhinitis due to animal (cat) (dog) hair and dander: Secondary | ICD-10-CM | POA: Diagnosis not present

## 2020-12-27 DIAGNOSIS — J3089 Other allergic rhinitis: Secondary | ICD-10-CM | POA: Diagnosis not present

## 2020-12-27 DIAGNOSIS — J301 Allergic rhinitis due to pollen: Secondary | ICD-10-CM | POA: Diagnosis not present

## 2020-12-27 DIAGNOSIS — J3081 Allergic rhinitis due to animal (cat) (dog) hair and dander: Secondary | ICD-10-CM | POA: Diagnosis not present

## 2020-12-28 ENCOUNTER — Ambulatory Visit
Admission: RE | Admit: 2020-12-28 | Discharge: 2020-12-28 | Disposition: A | Discharge status: Home / Self Care | Payer: Medicare Other | Source: Ambulatory Visit | Attending: Family Medicine | Admitting: Family Medicine

## 2020-12-28 ENCOUNTER — Other Ambulatory Visit: Payer: Self-pay

## 2020-12-28 DIAGNOSIS — M8589 Other specified disorders of bone density and structure, multiple sites: Secondary | ICD-10-CM | POA: Diagnosis not present

## 2020-12-28 DIAGNOSIS — M8580 Other specified disorders of bone density and structure, unspecified site: Secondary | ICD-10-CM

## 2020-12-28 DIAGNOSIS — Z780 Asymptomatic menopausal state: Secondary | ICD-10-CM | POA: Diagnosis not present

## 2021-01-03 DIAGNOSIS — J301 Allergic rhinitis due to pollen: Secondary | ICD-10-CM | POA: Diagnosis not present

## 2021-01-03 DIAGNOSIS — J3081 Allergic rhinitis due to animal (cat) (dog) hair and dander: Secondary | ICD-10-CM | POA: Diagnosis not present

## 2021-01-03 DIAGNOSIS — J3089 Other allergic rhinitis: Secondary | ICD-10-CM | POA: Diagnosis not present

## 2021-01-04 DIAGNOSIS — R21 Rash and other nonspecific skin eruption: Secondary | ICD-10-CM | POA: Diagnosis not present

## 2021-01-04 DIAGNOSIS — F22 Delusional disorders: Secondary | ICD-10-CM | POA: Diagnosis not present

## 2021-01-04 DIAGNOSIS — I10 Essential (primary) hypertension: Secondary | ICD-10-CM | POA: Diagnosis not present

## 2021-01-04 DIAGNOSIS — G250 Essential tremor: Secondary | ICD-10-CM | POA: Diagnosis not present

## 2021-01-19 DIAGNOSIS — J301 Allergic rhinitis due to pollen: Secondary | ICD-10-CM | POA: Diagnosis not present

## 2021-01-19 DIAGNOSIS — Z23 Encounter for immunization: Secondary | ICD-10-CM | POA: Diagnosis not present

## 2021-01-19 DIAGNOSIS — J3081 Allergic rhinitis due to animal (cat) (dog) hair and dander: Secondary | ICD-10-CM | POA: Diagnosis not present

## 2021-01-19 DIAGNOSIS — J3089 Other allergic rhinitis: Secondary | ICD-10-CM | POA: Diagnosis not present

## 2021-01-26 DIAGNOSIS — J3081 Allergic rhinitis due to animal (cat) (dog) hair and dander: Secondary | ICD-10-CM | POA: Diagnosis not present

## 2021-01-26 DIAGNOSIS — J3089 Other allergic rhinitis: Secondary | ICD-10-CM | POA: Diagnosis not present

## 2021-01-26 DIAGNOSIS — J301 Allergic rhinitis due to pollen: Secondary | ICD-10-CM | POA: Diagnosis not present

## 2021-02-02 DIAGNOSIS — J3089 Other allergic rhinitis: Secondary | ICD-10-CM | POA: Diagnosis not present

## 2021-02-02 DIAGNOSIS — J3081 Allergic rhinitis due to animal (cat) (dog) hair and dander: Secondary | ICD-10-CM | POA: Diagnosis not present

## 2021-02-02 DIAGNOSIS — J301 Allergic rhinitis due to pollen: Secondary | ICD-10-CM | POA: Diagnosis not present

## 2021-02-07 DIAGNOSIS — L570 Actinic keratosis: Secondary | ICD-10-CM | POA: Diagnosis not present

## 2021-02-07 DIAGNOSIS — X32XXXD Exposure to sunlight, subsequent encounter: Secondary | ICD-10-CM | POA: Diagnosis not present

## 2021-02-07 DIAGNOSIS — D225 Melanocytic nevi of trunk: Secondary | ICD-10-CM | POA: Diagnosis not present

## 2021-02-07 DIAGNOSIS — Z1283 Encounter for screening for malignant neoplasm of skin: Secondary | ICD-10-CM | POA: Diagnosis not present

## 2021-02-09 DIAGNOSIS — J301 Allergic rhinitis due to pollen: Secondary | ICD-10-CM | POA: Diagnosis not present

## 2021-02-09 DIAGNOSIS — J3081 Allergic rhinitis due to animal (cat) (dog) hair and dander: Secondary | ICD-10-CM | POA: Diagnosis not present

## 2021-02-09 DIAGNOSIS — J3089 Other allergic rhinitis: Secondary | ICD-10-CM | POA: Diagnosis not present
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
## 2021-02-15 ENCOUNTER — Encounter: Payer: Self-pay | Admitting: Physician Assistant

## 2021-02-15 ENCOUNTER — Ambulatory Visit (INDEPENDENT_AMBULATORY_CARE_PROVIDER_SITE_OTHER): Payer: Medicare Other | Admitting: Physician Assistant

## 2021-02-15 ENCOUNTER — Encounter (INDEPENDENT_AMBULATORY_CARE_PROVIDER_SITE_OTHER): Payer: Self-pay

## 2021-02-15 ENCOUNTER — Other Ambulatory Visit: Payer: Self-pay

## 2021-02-15 VITALS — BP 140/80 | HR 60 | Ht 59.0 in | Wt 151.2 lb

## 2021-02-15 DIAGNOSIS — G250 Essential tremor: Secondary | ICD-10-CM | POA: Diagnosis not present

## 2021-02-15 DIAGNOSIS — I10 Essential (primary) hypertension: Secondary | ICD-10-CM

## 2021-02-15 DIAGNOSIS — I2510 Atherosclerotic heart disease of native coronary artery without angina pectoris: Secondary | ICD-10-CM

## 2021-02-15 NOTE — Patient Instructions (Signed)
Medication Instructions:  Your physician recommends that you continue on your current medications as directed. Please refer to the Current Medication list given to you today.  *If you need a refill on your cardiac medications before your next appointment, please call your pharmacy*   Lab Work: None If you have labs (blood work) drawn today and your tests are completely normal, you will receive your results only by: [place] (if you have MyChart) OR A paper copy in the mail If you have any lab test that is abnormal or we need to change your treatment, we will call you to review the results.   Follow-Up: At [facility], you and your health needs are our priority.  As part of our continuing mission to provide you with exceptional heart care, we have created designated Provider Care Teams.  These Care Teams include your primary Cardiologist (physician) and Advanced Practice Providers (APPs -  Physician Assistants and Nurse Practitioners) who all work together to provide you with the care you need, when you need it.  We recommend signing up for the patient portal called "MyChart".  Sign up information is provided on this After Visit Summary.  MyChart is used to connect with patients for Virtual Visits (Telemedicine).  Patients are able to view lab/test results, encounter notes, upcoming appointments, etc.  Non-urgent messages can be sent to your provider as well.   To learn more about what you can do with MyChart, go to [email or web address].    Your next appointment:   1 year(s)  The format for your next appointment:   In Person  Provider:   Freada Bergeron, MD {

## 2021-02-16 DIAGNOSIS — J3081 Allergic rhinitis due to animal (cat) (dog) hair and dander: Secondary | ICD-10-CM | POA: Diagnosis not present

## 2021-02-16 DIAGNOSIS — J301 Allergic rhinitis due to pollen: Secondary | ICD-10-CM | POA: Diagnosis not present

## 2021-02-16 DIAGNOSIS — J3089 Other allergic rhinitis: Secondary | ICD-10-CM | POA: Diagnosis not present

## 2021-02-24 DIAGNOSIS — J3081 Allergic rhinitis due to animal (cat) (dog) hair and dander: Secondary | ICD-10-CM | POA: Diagnosis not present

## 2021-02-24 DIAGNOSIS — J301 Allergic rhinitis due to pollen: Secondary | ICD-10-CM | POA: Diagnosis not present

## 2021-02-24 DIAGNOSIS — J3089 Other allergic rhinitis: Secondary | ICD-10-CM | POA: Diagnosis not present

## 2021-02-27 DIAGNOSIS — Z0000 Encounter for general adult medical examination without abnormal findings: Secondary | ICD-10-CM | POA: Diagnosis not present

## 2021-02-27 DIAGNOSIS — G479 Sleep disorder, unspecified: Secondary | ICD-10-CM | POA: Diagnosis not present

## 2021-02-27 DIAGNOSIS — G250 Essential tremor: Secondary | ICD-10-CM | POA: Diagnosis not present

## 2021-02-27 DIAGNOSIS — E559 Vitamin D deficiency, unspecified: Secondary | ICD-10-CM | POA: Diagnosis not present

## 2021-02-27 DIAGNOSIS — R609 Edema, unspecified: Secondary | ICD-10-CM | POA: Diagnosis not present

## 2021-02-27 DIAGNOSIS — E669 Obesity, unspecified: Secondary | ICD-10-CM | POA: Diagnosis not present

## 2021-02-27 DIAGNOSIS — I10 Essential (primary) hypertension: Secondary | ICD-10-CM | POA: Diagnosis not present

## 2021-02-27 DIAGNOSIS — F411 Generalized anxiety disorder: Secondary | ICD-10-CM | POA: Diagnosis not present

## 2021-03-01 DIAGNOSIS — J3081 Allergic rhinitis due to animal (cat) (dog) hair and dander: Secondary | ICD-10-CM | POA: Diagnosis not present

## 2021-03-01 DIAGNOSIS — J3089 Other allergic rhinitis: Secondary | ICD-10-CM | POA: Diagnosis not present

## 2021-03-01 DIAGNOSIS — J301 Allergic rhinitis due to pollen: Secondary | ICD-10-CM | POA: Diagnosis not present

## 2021-03-08 DIAGNOSIS — J301 Allergic rhinitis due to pollen: Secondary | ICD-10-CM | POA: Diagnosis not present

## 2021-03-08 DIAGNOSIS — J3081 Allergic rhinitis due to animal (cat) (dog) hair and dander: Secondary | ICD-10-CM | POA: Diagnosis not present

## 2021-03-08 DIAGNOSIS — J3089 Other allergic rhinitis: Secondary | ICD-10-CM | POA: Diagnosis not present

## 2021-03-15 DIAGNOSIS — J3081 Allergic rhinitis due to animal (cat) (dog) hair and dander: Secondary | ICD-10-CM | POA: Diagnosis not present

## 2021-03-15 DIAGNOSIS — J3089 Other allergic rhinitis: Secondary | ICD-10-CM | POA: Diagnosis not present

## 2021-03-15 DIAGNOSIS — J301 Allergic rhinitis due to pollen: Secondary | ICD-10-CM | POA: Diagnosis not present

## 2021-03-20 DIAGNOSIS — H401132 Primary open-angle glaucoma, bilateral, moderate stage: Secondary | ICD-10-CM | POA: Diagnosis not present

## 2021-03-21 DIAGNOSIS — J3089 Other allergic rhinitis: Secondary | ICD-10-CM | POA: Diagnosis not present

## 2021-03-21 DIAGNOSIS — J301 Allergic rhinitis due to pollen: Secondary | ICD-10-CM | POA: Diagnosis not present

## 2021-03-21 DIAGNOSIS — J3081 Allergic rhinitis due to animal (cat) (dog) hair and dander: Secondary | ICD-10-CM | POA: Diagnosis not present

## 2021-03-23 ENCOUNTER — Other Ambulatory Visit: Payer: Self-pay | Admitting: Family Medicine

## 2021-03-23 DIAGNOSIS — Z1231 Encounter for screening mammogram for malignant neoplasm of breast: Secondary | ICD-10-CM

## 2021-03-28 DIAGNOSIS — J3089 Other allergic rhinitis: Secondary | ICD-10-CM | POA: Diagnosis not present

## 2021-03-28 DIAGNOSIS — J301 Allergic rhinitis due to pollen: Secondary | ICD-10-CM | POA: Diagnosis not present

## 2021-03-28 DIAGNOSIS — J3081 Allergic rhinitis due to animal (cat) (dog) hair and dander: Secondary | ICD-10-CM | POA: Diagnosis not present

## 2021-04-05 DIAGNOSIS — J301 Allergic rhinitis due to pollen: Secondary | ICD-10-CM | POA: Diagnosis not present

## 2021-04-05 DIAGNOSIS — J3089 Other allergic rhinitis: Secondary | ICD-10-CM | POA: Diagnosis not present

## 2021-04-12 DIAGNOSIS — J301 Allergic rhinitis due to pollen: Secondary | ICD-10-CM | POA: Diagnosis not present

## 2021-04-12 DIAGNOSIS — J3081 Allergic rhinitis due to animal (cat) (dog) hair and dander: Secondary | ICD-10-CM | POA: Diagnosis not present

## 2021-04-12 DIAGNOSIS — J3089 Other allergic rhinitis: Secondary | ICD-10-CM | POA: Diagnosis not present

## 2021-04-14 DIAGNOSIS — H401132 Primary open-angle glaucoma, bilateral, moderate stage: Secondary | ICD-10-CM | POA: Diagnosis not present

## 2021-04-19 DIAGNOSIS — J301 Allergic rhinitis due to pollen: Secondary | ICD-10-CM | POA: Diagnosis not present

## 2021-04-19 DIAGNOSIS — J3089 Other allergic rhinitis: Secondary | ICD-10-CM | POA: Diagnosis not present

## 2021-04-19 DIAGNOSIS — J3081 Allergic rhinitis due to animal (cat) (dog) hair and dander: Secondary | ICD-10-CM | POA: Diagnosis not present

## 2021-04-27 DIAGNOSIS — J3081 Allergic rhinitis due to animal (cat) (dog) hair and dander: Secondary | ICD-10-CM | POA: Diagnosis not present

## 2021-04-27 DIAGNOSIS — J301 Allergic rhinitis due to pollen: Secondary | ICD-10-CM | POA: Diagnosis not present

## 2021-04-27 DIAGNOSIS — J3089 Other allergic rhinitis: Secondary | ICD-10-CM | POA: Diagnosis not present

## 2021-05-04 DIAGNOSIS — J3089 Other allergic rhinitis: Secondary | ICD-10-CM | POA: Diagnosis not present

## 2021-05-04 DIAGNOSIS — J3081 Allergic rhinitis due to animal (cat) (dog) hair and dander: Secondary | ICD-10-CM | POA: Diagnosis not present

## 2021-05-04 DIAGNOSIS — J301 Allergic rhinitis due to pollen: Secondary | ICD-10-CM | POA: Diagnosis not present

## 2021-05-04 DIAGNOSIS — R419 Unspecified symptoms and signs involving cognitive functions and awareness: Secondary | ICD-10-CM | POA: Diagnosis not present

## 2021-05-08 ENCOUNTER — Ambulatory Visit
Admission: RE | Admit: 2021-05-08 | Discharge: 2021-05-08 | Disposition: A | Discharge status: Home / Self Care | Payer: Medicare Other | Source: Ambulatory Visit | Attending: Family Medicine | Admitting: Family Medicine

## 2021-05-08 DIAGNOSIS — Z1231 Encounter for screening mammogram for malignant neoplasm of breast: Secondary | ICD-10-CM

## 2021-05-11 DIAGNOSIS — M2540 Effusion, unspecified joint: Secondary | ICD-10-CM | POA: Diagnosis not present

## 2021-05-11 DIAGNOSIS — I10 Essential (primary) hypertension: Secondary | ICD-10-CM | POA: Diagnosis not present

## 2021-05-12 DIAGNOSIS — J3081 Allergic rhinitis due to animal (cat) (dog) hair and dander: Secondary | ICD-10-CM | POA: Diagnosis not present

## 2021-05-12 DIAGNOSIS — J3089 Other allergic rhinitis: Secondary | ICD-10-CM | POA: Diagnosis not present

## 2021-05-12 DIAGNOSIS — J301 Allergic rhinitis due to pollen: Secondary | ICD-10-CM | POA: Diagnosis not present

## 2021-05-22 DIAGNOSIS — J301 Allergic rhinitis due to pollen: Secondary | ICD-10-CM | POA: Diagnosis not present

## 2021-05-22 DIAGNOSIS — J3089 Other allergic rhinitis: Secondary | ICD-10-CM | POA: Diagnosis not present

## 2021-05-22 DIAGNOSIS — J3081 Allergic rhinitis due to animal (cat) (dog) hair and dander: Secondary | ICD-10-CM | POA: Diagnosis not present

## 2021-05-23 IMAGING — MG DIGITAL SCREENING BILAT W/ TOMO W/ CAD
8 series · 8 of 24 positions shown · non-contrast
Comparison: Previous exam(s).

CLINICAL DATA: Screening.

EXAM:
DIGITAL SCREENING BILATERAL MAMMOGRAM WITH TOMO AND CAD

[L CC synth-2D]
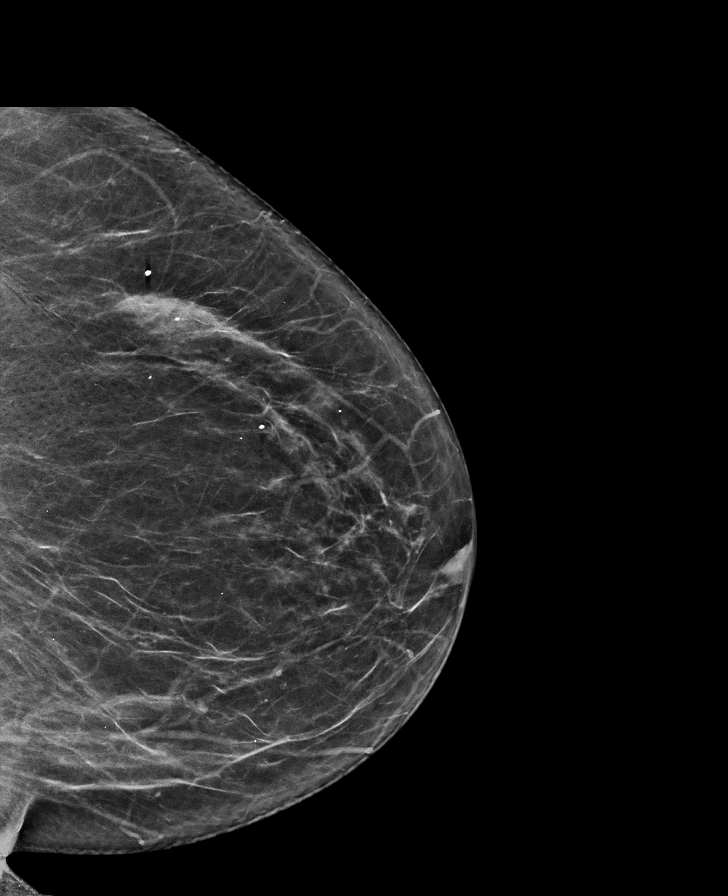

[L MLO synth-2D]
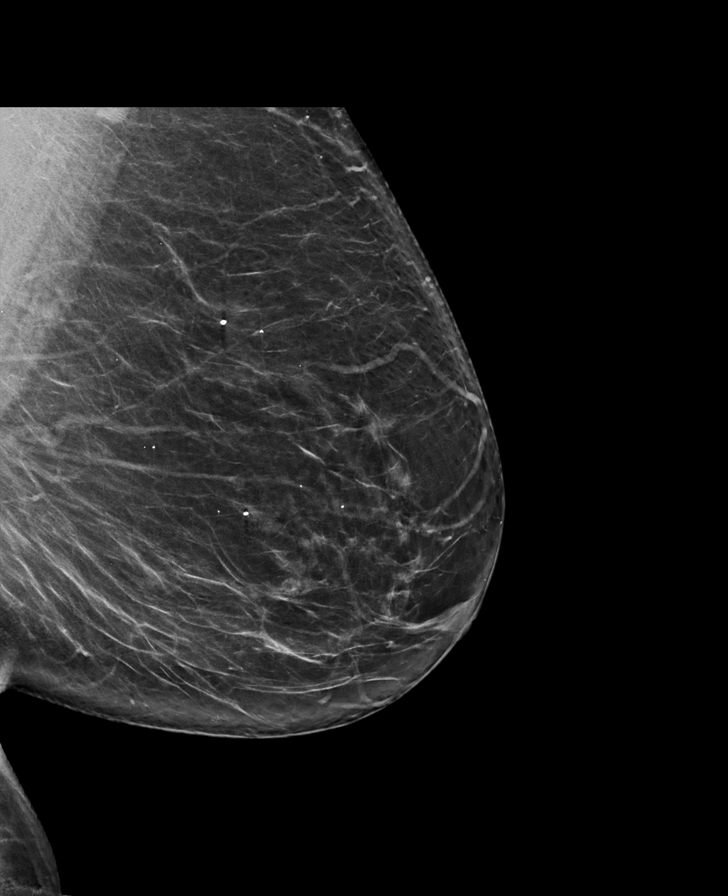

[R MLO synth-2D]
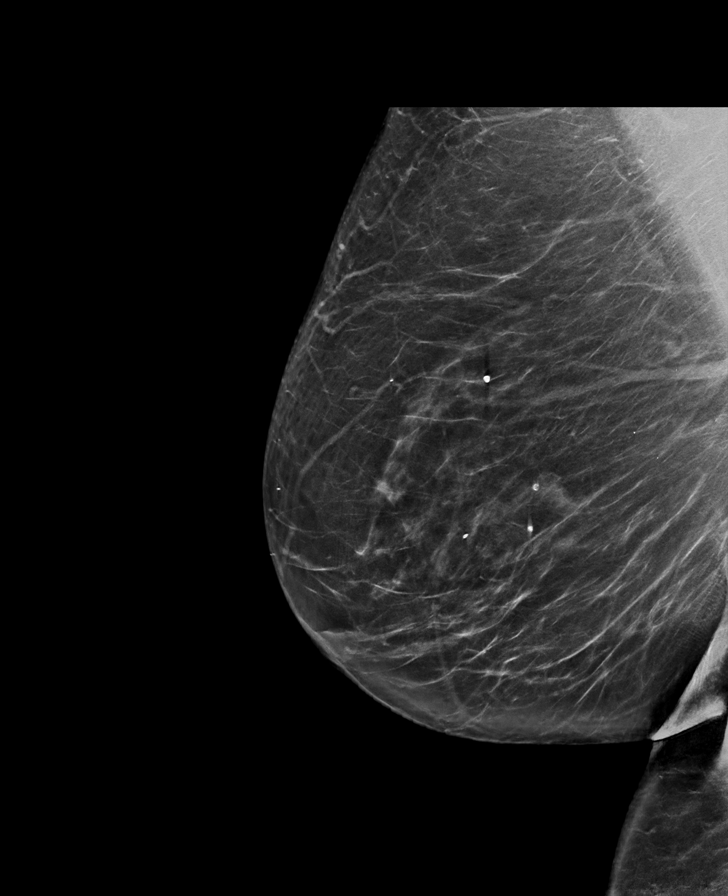

[R CC synth-2D]
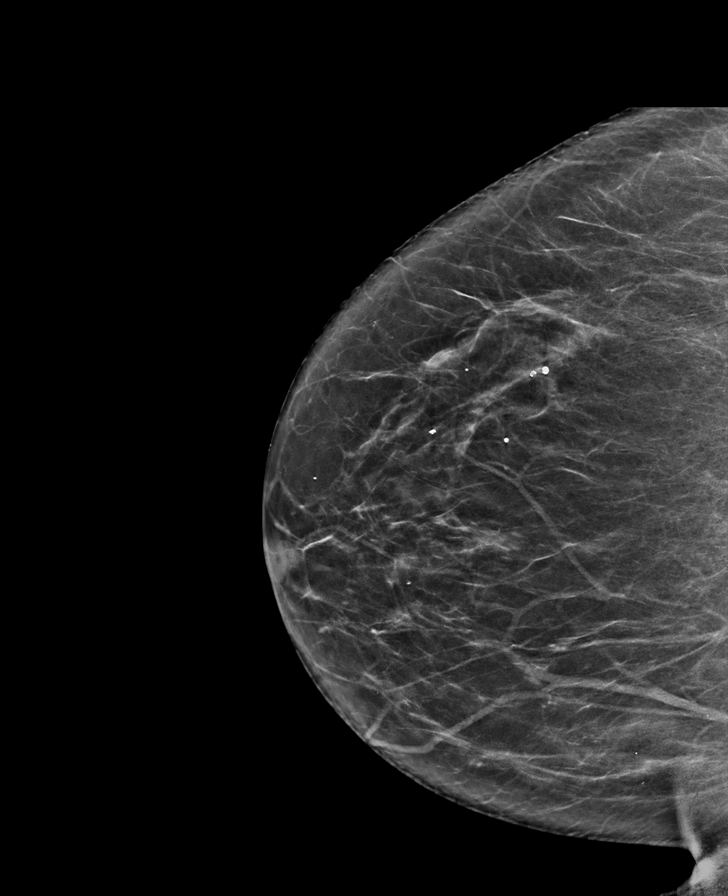

[L MLO tomo · tomo slice 42/83.0]
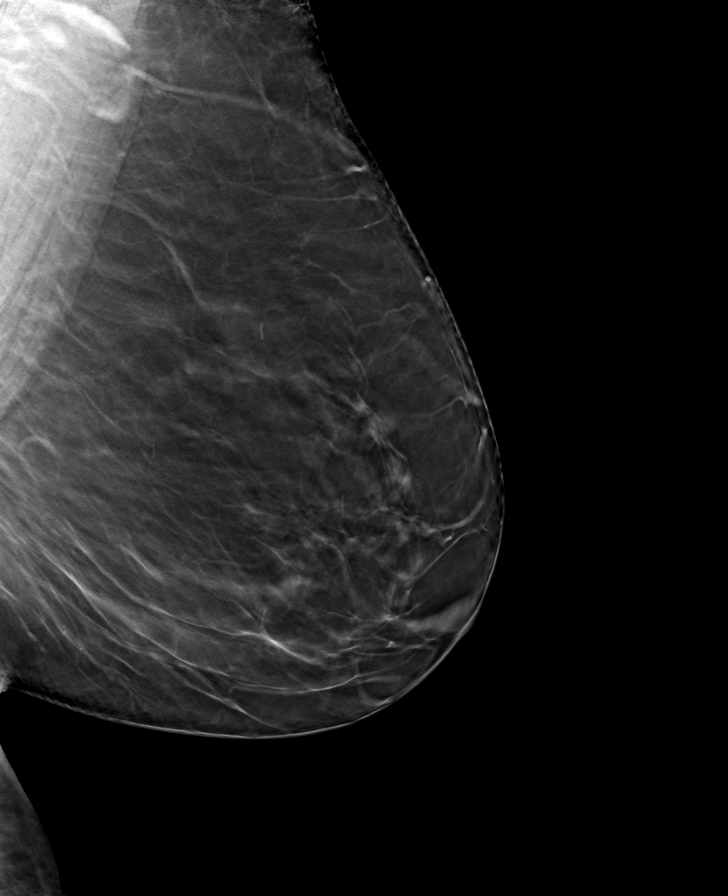

[R CC tomo · tomo slice 35/70.0]
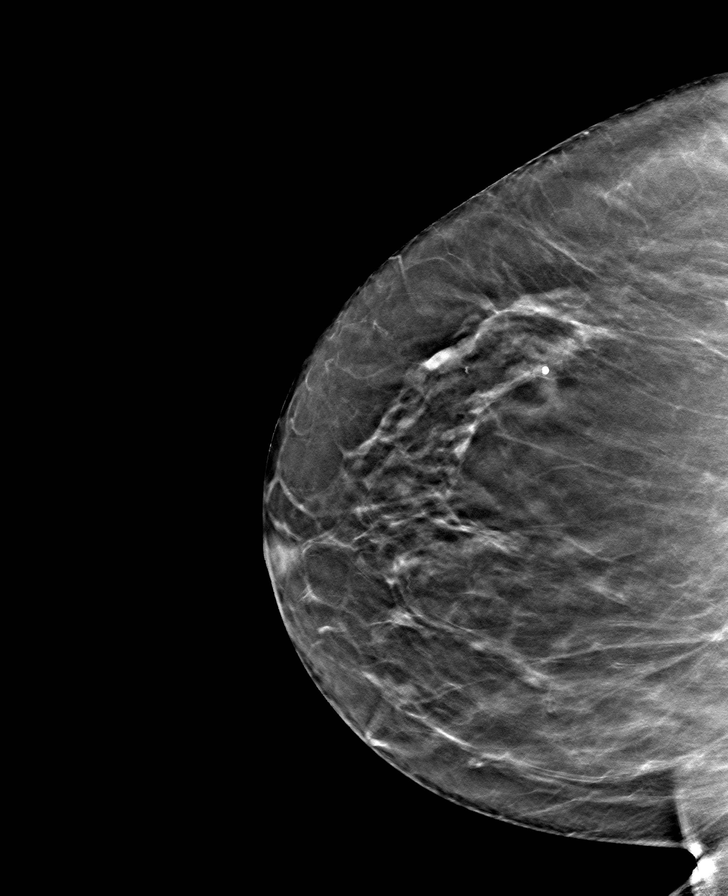

[L CC tomo · tomo slice 35/70.0]
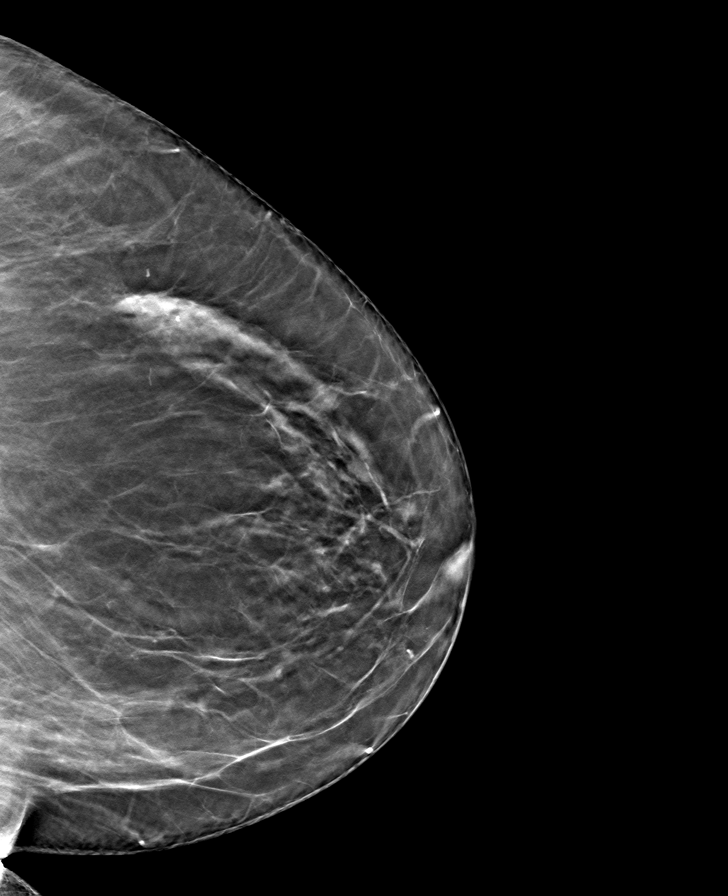

[R MLO tomo · tomo slice 43/86.0]
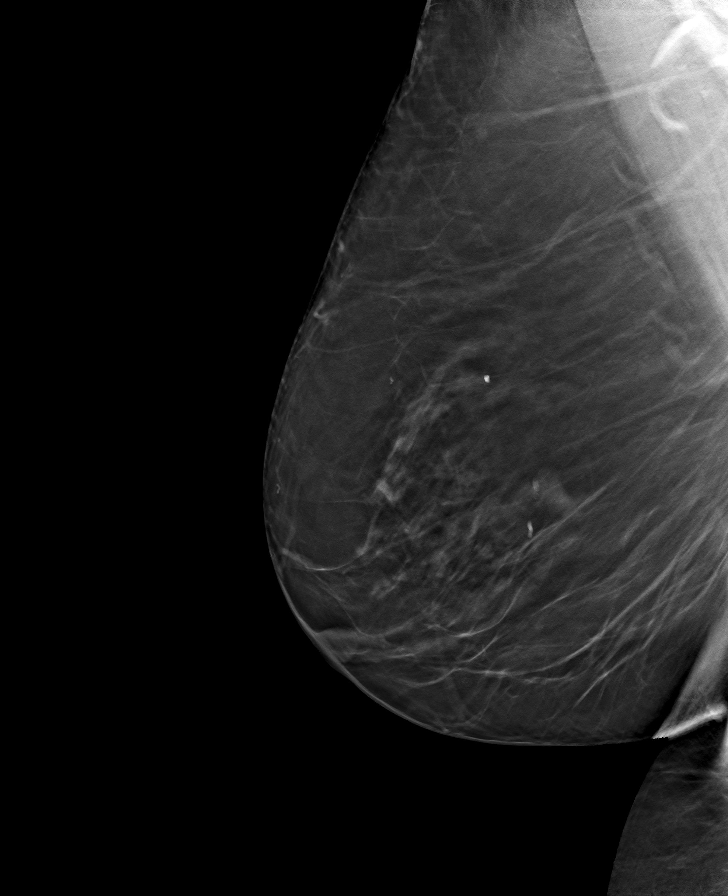

[8 of 24 positions shown; findings below may reference images not displayed]

ACR Breast Density Category b: There are scattered areas of
fibroglandular density.
FINDINGS: There are no findings suspicious for malignancy. Images were
processed with CAD.
IMPRESSION: No mammographic evidence of malignancy. A result letter of this
screening mammogram will be mailed directly to the patient.

RECOMMENDATION:
Screening mammogram in one year. (Code:[id number])

BI-RADS CATEGORY  1: Negative.

## 2021-06-05 DIAGNOSIS — J3081 Allergic rhinitis due to animal (cat) (dog) hair and dander: Secondary | ICD-10-CM | POA: Diagnosis not present

## 2021-06-05 DIAGNOSIS — J3089 Other allergic rhinitis: Secondary | ICD-10-CM | POA: Diagnosis not present

## 2021-06-05 DIAGNOSIS — J301 Allergic rhinitis due to pollen: Secondary | ICD-10-CM | POA: Diagnosis not present

## 2021-06-12 DIAGNOSIS — J301 Allergic rhinitis due to pollen: Secondary | ICD-10-CM | POA: Diagnosis not present

## 2021-06-12 DIAGNOSIS — J3089 Other allergic rhinitis: Secondary | ICD-10-CM | POA: Diagnosis not present

## 2021-06-12 DIAGNOSIS — J3081 Allergic rhinitis due to animal (cat) (dog) hair and dander: Secondary | ICD-10-CM | POA: Diagnosis not present

## 2021-06-19 DIAGNOSIS — J3089 Other allergic rhinitis: Secondary | ICD-10-CM | POA: Diagnosis not present

## 2021-06-19 DIAGNOSIS — J3081 Allergic rhinitis due to animal (cat) (dog) hair and dander: Secondary | ICD-10-CM | POA: Diagnosis not present

## 2021-06-19 DIAGNOSIS — J301 Allergic rhinitis due to pollen: Secondary | ICD-10-CM | POA: Diagnosis not present

## 2021-06-21 DIAGNOSIS — H2511 Age-related nuclear cataract, right eye: Secondary | ICD-10-CM | POA: Diagnosis not present

## 2021-06-21 DIAGNOSIS — H401122 Primary open-angle glaucoma, left eye, moderate stage: Secondary | ICD-10-CM | POA: Diagnosis not present

## 2021-06-21 DIAGNOSIS — H401112 Primary open-angle glaucoma, right eye, moderate stage: Secondary | ICD-10-CM | POA: Diagnosis not present

## 2021-06-21 DIAGNOSIS — H25811 Combined forms of age-related cataract, right eye: Secondary | ICD-10-CM | POA: Diagnosis not present

## 2021-06-21 DIAGNOSIS — H401111 Primary open-angle glaucoma, right eye, mild stage: Secondary | ICD-10-CM | POA: Diagnosis not present

## 2021-06-26 DIAGNOSIS — J301 Allergic rhinitis due to pollen: Secondary | ICD-10-CM | POA: Diagnosis not present

## 2021-06-26 DIAGNOSIS — J3089 Other allergic rhinitis: Secondary | ICD-10-CM | POA: Diagnosis not present

## 2021-06-26 DIAGNOSIS — J3081 Allergic rhinitis due to animal (cat) (dog) hair and dander: Secondary | ICD-10-CM | POA: Diagnosis not present

## 2021-07-03 DIAGNOSIS — J3089 Other allergic rhinitis: Secondary | ICD-10-CM | POA: Diagnosis not present

## 2021-07-03 DIAGNOSIS — J3081 Allergic rhinitis due to animal (cat) (dog) hair and dander: Secondary | ICD-10-CM | POA: Diagnosis not present

## 2021-07-03 DIAGNOSIS — J301 Allergic rhinitis due to pollen: Secondary | ICD-10-CM | POA: Diagnosis not present

## 2021-07-05 DIAGNOSIS — J3089 Other allergic rhinitis: Secondary | ICD-10-CM | POA: Diagnosis not present

## 2021-07-05 DIAGNOSIS — F419 Anxiety disorder, unspecified: Secondary | ICD-10-CM | POA: Diagnosis not present

## 2021-07-10 DIAGNOSIS — J3081 Allergic rhinitis due to animal (cat) (dog) hair and dander: Secondary | ICD-10-CM | POA: Diagnosis not present

## 2021-07-10 DIAGNOSIS — J3089 Other allergic rhinitis: Secondary | ICD-10-CM | POA: Diagnosis not present

## 2021-07-10 DIAGNOSIS — J301 Allergic rhinitis due to pollen: Secondary | ICD-10-CM | POA: Diagnosis not present

## 2021-07-17 DIAGNOSIS — J3089 Other allergic rhinitis: Secondary | ICD-10-CM | POA: Diagnosis not present

## 2021-07-17 DIAGNOSIS — J3081 Allergic rhinitis due to animal (cat) (dog) hair and dander: Secondary | ICD-10-CM | POA: Diagnosis not present

## 2021-07-17 DIAGNOSIS — J301 Allergic rhinitis due to pollen: Secondary | ICD-10-CM | POA: Diagnosis not present

## 2021-07-19 DIAGNOSIS — J301 Allergic rhinitis due to pollen: Secondary | ICD-10-CM | POA: Diagnosis not present

## 2021-07-19 DIAGNOSIS — J3089 Other allergic rhinitis: Secondary | ICD-10-CM | POA: Diagnosis not present

## 2021-07-19 DIAGNOSIS — T63443D Toxic effect of venom of bees, assault, subsequent encounter: Secondary | ICD-10-CM | POA: Diagnosis not present

## 2021-07-19 DIAGNOSIS — J3081 Allergic rhinitis due to animal (cat) (dog) hair and dander: Secondary | ICD-10-CM | POA: Diagnosis not present

## 2021-07-24 DIAGNOSIS — J3089 Other allergic rhinitis: Secondary | ICD-10-CM | POA: Diagnosis not present

## 2021-07-24 DIAGNOSIS — J301 Allergic rhinitis due to pollen: Secondary | ICD-10-CM | POA: Diagnosis not present

## 2021-07-24 DIAGNOSIS — J3081 Allergic rhinitis due to animal (cat) (dog) hair and dander: Secondary | ICD-10-CM | POA: Diagnosis not present

## 2021-07-26 DIAGNOSIS — F419 Anxiety disorder, unspecified: Secondary | ICD-10-CM | POA: Diagnosis not present

## 2021-07-31 DIAGNOSIS — J301 Allergic rhinitis due to pollen: Secondary | ICD-10-CM | POA: Diagnosis not present

## 2021-07-31 DIAGNOSIS — J3089 Other allergic rhinitis: Secondary | ICD-10-CM | POA: Diagnosis not present

## 2021-07-31 DIAGNOSIS — J3081 Allergic rhinitis due to animal (cat) (dog) hair and dander: Secondary | ICD-10-CM | POA: Diagnosis not present

## 2021-08-02 NOTE — Progress Notes (Signed)
? ? ?Assessment/Plan:  ? ? ?1.  Essential Tremor ? -She is currently on 300 mg in the morning and 300 mg at bedtime but is taking 100 mg capsules.  Would be easier to take 300 mg capsules.  She wants to finish what is left in her current bottle and then she will call me for refill.  It is not adequately controlling tremor, but she reported she did not tolerate primidone although she did not give it an adequate trial probably.  ? -she asked about focused ultrasound.  We discussed it.  I have concerns about loss of balance and don't think she would be a good candidate, in addition to cognitive/psych concerns.  She did mention her brother had deep brain stimulation.  Again, I do not think she would be a candidate because of psychiatric issues. ? ?2.  Psychosis ? -Patient is awaiting court recommendations regarding competency.  She had previously declined neurocognitive testing here.  She declined MoCA today. ? -It would be very unusual for her to have pure baseline dementia and be able to give such accurate history in all other areas, with such extreme and vivid delusions in this 1 area of her life.  Discussed with her, however, that it would be of benefit to have an MRI of the brain.  This would likely show atrophy and small vessel disease, as it does in most patients of this age group.  We would just like to make sure we are not missing anything further.  She would like to think about this.  States that she is leery about this given her situation of being taken to court, but she is also concerned about cost, which is a reasonable concern.  She will let me know. ? -Unfortunately, this is really within the realm of psychiatry, and completely out of my area of expertise.  We are glad to see that she has gotten intervention now in this regard. ? ?3.  Cervical dystonia with vocal tremor ? -could do botox, but the patient really is not bothered by this aspect. ? ?Subjective:  ? ?Annette Mathews was seen today in follow up  for tremor.  My previous records were reviewed prior to todays visit.  Patient currently on gabapentin for essential tremor, 3 tablets in the morning and 2 tablets at night.  Pt states that she did well in terms of tremor until July when she was IVC over near [place].  When there, her propranolol was d/c per pt (I didn't think she was on it anyway).  She states that they wanted her to take something different, which she did not want to take.  When she was discharged, she followed up with her primary care physician and she states that insurance wouldn't pay for propranolol (probably wouldn't pay for propranolol LA).  States that she is now on atenolol.  She finds that is not as effective as the propranolol LA.  She is on gabapentin.  She is still struggling with snaps, buttons, zippers.  Trouble with buttons.  Been under "excessive stress."  That is making tremor worse.  Pt states that she is not getting more than 4 hours of sleep per night and she isn't sleeping well and that is driving up her stress.  Reviewed primary care notes.  Noted continued delusions.  Noted that patient was working with courts.  Did not note any medications given or intervention with psychiatry.  Patient states that she went to court Friday to determine competency.  She states that her evaluation from [facility] found her competent, but delusional.  She is awaiting the courts recommendations on competency. ? ?Current prescribed movement disorder medications: ?Gabapentin, 100 mg 3 times daily (states that she is taking 3 in the AM and 2 at night) ?Atenolol prescribed by PCP for tremor ? ?Prior medications: Inderal LA, 80 mg 3 times per day (stopped because of depression, but then apparently retried and did fine but taken off when insurance stopped paying for the long-acting) primidone, tried 1 dose by neurologist in [place] and had nausea and vomiting.  (First dose effect).  We had her retry it and she stated she developed diarrhea (not a  side effect I had previously) and states she did not feel good on it and stopped it. ?ALLERGIES:   ?Allergies  ?Allergen Reactions  ? Iodine-131 Hives  ? Montelukast Other (See Comments)  ?  tremors  ? Sertraline Other (See Comments)  ?  TREMORS, INSOMNIA   ? Tramadol Other (See Comments)  ?  ITCHING, VOMITING.   ? Codeine   ? Erythromycin   ? Hydrocodone-Acetaminophen   ? Iodinated Contrast Media   ? Meperidine   ? Montelukast Sodium   ? Morphine Sulfate   ? ? ?CURRENT MEDICATIONS:  ?Outpatient Encounter Medications as of 08/03/2021  ?Medication Sig  ? ALPRAZolam (XANAX) 0.5 MG tablet Take 0.5 mg by mouth 2 (two) times daily as needed for anxiety.  ? AMOXICILLIN PO Take 2,000 mg by mouth. Every 6 months when going to dentist  ? atenolol (TENORMIN) 50 MG tablet Take 100 mg by mouth 2 (two) times daily.  ? gabapentin (NEURONTIN) 100 MG capsule Take 100 mg by mouth 3 (three) times daily.  ? hydrochlorothiazide (HYDRODIURIL) 25 MG tablet Take 25 mg by mouth daily as needed (high blood pressure).  ? ipratropium (ATROVENT) 0.06 % nasal spray Place 2 sprays into both nostrils 2 (two) times daily as needed for rhinitis. 2 drops in each nostril as needed  ? latanoprost (XALATAN) 0.005 % ophthalmic solution Place 1 drop into both eyes at bedtime. For glaucoma  ? losartan (COZAAR) 100 MG tablet Take 100 mg by mouth daily.  ? [DISCONTINUED] PROPRANOLOL HCL PO Take 80-160 mg by mouth 2 (two) times daily. 160 mg in the AM, 80 mg at night (Patient not taking: Reported on 02/15/2021)  ? ?No facility-administered encounter medications on file as of 08/03/2021.  ? ? ? ?Objective:  ? ? ?PHYSICAL EXAMINATION:   ? ?VITALS:   ?Vitals:  ? 08/03/21 1516  ?BP: (!) 150/72  ?Pulse: 71  ?SpO2: 94%  ?Weight: 155 lb 6.4 oz (70.5 kg)  ?Height: '4\' 9"'$  (1.448 m)  ? ? ? ?GEN:  The patient appears stated age and is in NAD.  She is very pleasant today, with the exception of when she is talking about the person living in her attic ?HEENT:  Normocephalic,  atraumatic.  The mucous membranes are moist. The superficial temporal arteries are without ropiness or tenderness. ?CV:  RRR ?Lungs:  CTAB ?Neck:  head to the right with some L ear to the L shoulder ?Neurological examination: ? ?Orientation: The patient is alert.  She refused MoCA today stating that it was not in her best interest to participate with that as the courts are already making her do testing. ?Cranial nerves: There is good facial symmetry. The speech is fluent and clear.  She does have some vocal tremor.  Soft palate rises symmetrically and there is no tongue deviation. Hearing  is intact to conversational tone. ?Sensation: Sensation is intact to light touch throughout ?Motor: Strength is at least antigravity x4. ? ?Movement examination: ?Tone: There is normal tone in the UE/LE ?Abnormal movements: Patient has no rest tremor.  She has mild postural tremor.  She has significant trouble with Archimedes spirals, especially on the left.  This is about the same as previous.   ?Gait and Station: The patient pushes off of the chair to arise.  Her gait is wide-based, but she is ambulating fairly well. ? ? ? ?Total time spent on today's visit was 37 minutes, including both face-to-face time and nonface-to-face time.  Time included that spent on review of records (prior notes available to me/labs/imaging if pertinent), discussing treatment and goals, answering patient's questions and coordinating care. ? ?Cc:  Lawerance Cruel, MD ? ?

## 2021-08-03 ENCOUNTER — Encounter: Payer: Self-pay | Admitting: Neurology

## 2021-08-03 ENCOUNTER — Ambulatory Visit (INDEPENDENT_AMBULATORY_CARE_PROVIDER_SITE_OTHER): Payer: Medicare Other | Admitting: Neurology

## 2021-08-03 VITALS — BP 150/72 | HR 71 | Ht <= 58 in | Wt 155.4 lb

## 2021-08-03 DIAGNOSIS — G250 Essential tremor: Secondary | ICD-10-CM | POA: Diagnosis not present

## 2021-08-03 DIAGNOSIS — G243 Spasmodic torticollis: Secondary | ICD-10-CM | POA: Diagnosis not present

## 2021-08-03 DIAGNOSIS — F29 Unspecified psychosis not due to a substance or known physiological condition: Secondary | ICD-10-CM | POA: Diagnosis not present

## 2021-08-07 DIAGNOSIS — J3081 Allergic rhinitis due to animal (cat) (dog) hair and dander: Secondary | ICD-10-CM | POA: Diagnosis not present

## 2021-08-07 DIAGNOSIS — J301 Allergic rhinitis due to pollen: Secondary | ICD-10-CM | POA: Diagnosis not present

## 2021-08-07 DIAGNOSIS — J3089 Other allergic rhinitis: Secondary | ICD-10-CM | POA: Diagnosis not present

## 2021-08-14 DIAGNOSIS — J3089 Other allergic rhinitis: Secondary | ICD-10-CM | POA: Diagnosis not present

## 2021-08-14 DIAGNOSIS — J301 Allergic rhinitis due to pollen: Secondary | ICD-10-CM | POA: Diagnosis not present

## 2021-08-14 DIAGNOSIS — J3081 Allergic rhinitis due to animal (cat) (dog) hair and dander: Secondary | ICD-10-CM | POA: Diagnosis not present

## 2021-08-16 DIAGNOSIS — F419 Anxiety disorder, unspecified: Secondary | ICD-10-CM | POA: Diagnosis not present

## 2021-08-17 DIAGNOSIS — R21 Rash and other nonspecific skin eruption: Secondary | ICD-10-CM | POA: Diagnosis not present

## 2021-08-17 DIAGNOSIS — F22 Delusional disorders: Secondary | ICD-10-CM | POA: Diagnosis not present

## 2021-08-17 DIAGNOSIS — Z6832 Body mass index (BMI) 32.0-32.9, adult: Secondary | ICD-10-CM | POA: Diagnosis not present

## 2021-08-28 DIAGNOSIS — J3089 Other allergic rhinitis: Secondary | ICD-10-CM | POA: Diagnosis not present

## 2021-08-28 DIAGNOSIS — J3081 Allergic rhinitis due to animal (cat) (dog) hair and dander: Secondary | ICD-10-CM | POA: Diagnosis not present

## 2021-08-28 DIAGNOSIS — J301 Allergic rhinitis due to pollen: Secondary | ICD-10-CM | POA: Diagnosis not present

## 2021-08-30 DIAGNOSIS — F411 Generalized anxiety disorder: Secondary | ICD-10-CM | POA: Diagnosis not present

## 2021-08-30 DIAGNOSIS — I10 Essential (primary) hypertension: Secondary | ICD-10-CM | POA: Diagnosis not present

## 2021-08-30 DIAGNOSIS — D649 Anemia, unspecified: Secondary | ICD-10-CM | POA: Diagnosis not present

## 2021-09-06 DIAGNOSIS — J301 Allergic rhinitis due to pollen: Secondary | ICD-10-CM | POA: Diagnosis not present

## 2021-09-06 DIAGNOSIS — J3081 Allergic rhinitis due to animal (cat) (dog) hair and dander: Secondary | ICD-10-CM | POA: Diagnosis not present

## 2021-09-06 DIAGNOSIS — J3089 Other allergic rhinitis: Secondary | ICD-10-CM | POA: Diagnosis not present

## 2021-09-13 DIAGNOSIS — H401122 Primary open-angle glaucoma, left eye, moderate stage: Secondary | ICD-10-CM | POA: Diagnosis not present

## 2021-09-13 DIAGNOSIS — H269 Unspecified cataract: Secondary | ICD-10-CM | POA: Diagnosis not present

## 2021-09-13 DIAGNOSIS — H2512 Age-related nuclear cataract, left eye: Secondary | ICD-10-CM | POA: Diagnosis not present

## 2021-09-13 DIAGNOSIS — H401121 Primary open-angle glaucoma, left eye, mild stage: Secondary | ICD-10-CM | POA: Diagnosis not present

## 2021-09-15 DIAGNOSIS — J3089 Other allergic rhinitis: Secondary | ICD-10-CM | POA: Diagnosis not present

## 2021-09-15 DIAGNOSIS — J3081 Allergic rhinitis due to animal (cat) (dog) hair and dander: Secondary | ICD-10-CM | POA: Diagnosis not present

## 2021-09-15 DIAGNOSIS — J301 Allergic rhinitis due to pollen: Secondary | ICD-10-CM | POA: Diagnosis not present

## 2021-09-22 DIAGNOSIS — J3081 Allergic rhinitis due to animal (cat) (dog) hair and dander: Secondary | ICD-10-CM | POA: Diagnosis not present

## 2021-09-22 DIAGNOSIS — J301 Allergic rhinitis due to pollen: Secondary | ICD-10-CM | POA: Diagnosis not present

## 2021-09-22 DIAGNOSIS — J3089 Other allergic rhinitis: Secondary | ICD-10-CM | POA: Diagnosis not present

## 2021-09-24 ENCOUNTER — Emergency Department (HOSPITAL_COMMUNITY)
Admission: EM | Admit: 2021-09-24 | Discharge: 2021-09-24 | Disposition: A | Discharge status: Home / Self Care | Payer: Medicare Other | Attending: Emergency Medicine | Admitting: Emergency Medicine

## 2021-09-24 ENCOUNTER — Emergency Department (HOSPITAL_COMMUNITY): Payer: Medicare Other

## 2021-09-24 ENCOUNTER — Encounter (HOSPITAL_COMMUNITY): Payer: Self-pay

## 2021-09-24 ENCOUNTER — Other Ambulatory Visit: Payer: Self-pay

## 2021-09-24 DIAGNOSIS — Y92410 Unspecified street and highway as the place of occurrence of the external cause: Secondary | ICD-10-CM | POA: Diagnosis not present

## 2021-09-24 DIAGNOSIS — Z79899 Other long term (current) drug therapy: Secondary | ICD-10-CM | POA: Insufficient documentation

## 2021-09-24 DIAGNOSIS — S0990XA Unspecified injury of head, initial encounter: Secondary | ICD-10-CM | POA: Insufficient documentation

## 2021-09-24 DIAGNOSIS — M47812 Spondylosis without myelopathy or radiculopathy, cervical region: Secondary | ICD-10-CM | POA: Diagnosis not present

## 2021-09-24 DIAGNOSIS — S52501A Unspecified fracture of the lower end of right radius, initial encounter for closed fracture: Secondary | ICD-10-CM | POA: Diagnosis not present

## 2021-09-24 DIAGNOSIS — S52591A Other fractures of lower end of right radius, initial encounter for closed fracture: Secondary | ICD-10-CM | POA: Insufficient documentation

## 2021-09-24 DIAGNOSIS — I10 Essential (primary) hypertension: Secondary | ICD-10-CM | POA: Diagnosis not present

## 2021-09-24 DIAGNOSIS — I2510 Atherosclerotic heart disease of native coronary artery without angina pectoris: Secondary | ICD-10-CM | POA: Diagnosis not present

## 2021-09-24 DIAGNOSIS — S199XXA Unspecified injury of neck, initial encounter: Secondary | ICD-10-CM | POA: Diagnosis not present

## 2021-09-24 DIAGNOSIS — S299XXA Unspecified injury of thorax, initial encounter: Secondary | ICD-10-CM | POA: Diagnosis not present

## 2021-09-24 DIAGNOSIS — E876 Hypokalemia: Secondary | ICD-10-CM

## 2021-09-24 DIAGNOSIS — R55 Syncope and collapse: Secondary | ICD-10-CM | POA: Diagnosis not present

## 2021-09-24 DIAGNOSIS — S6991XA Unspecified injury of right wrist, hand and finger(s), initial encounter: Secondary | ICD-10-CM | POA: Diagnosis present

## 2021-09-24 DIAGNOSIS — M4313 Spondylolisthesis, cervicothoracic region: Secondary | ICD-10-CM | POA: Diagnosis not present

## 2021-09-24 DIAGNOSIS — R42 Dizziness and giddiness: Secondary | ICD-10-CM | POA: Diagnosis not present

## 2021-09-24 DIAGNOSIS — J320 Chronic maxillary sinusitis: Secondary | ICD-10-CM | POA: Diagnosis not present

## 2021-09-24 DIAGNOSIS — Z981 Arthrodesis status: Secondary | ICD-10-CM | POA: Diagnosis not present

## 2021-09-24 LAB — COMPREHENSIVE METABOLIC PANEL
ALT: 15 U/L (ref 0–44)
AST: 20 U/L (ref 15–41)
Albumin: 3.8 g/dL (ref 3.5–5.0)
Alkaline Phosphatase: 103 U/L (ref 38–126)
Anion gap: 10 (ref 5–15)
BUN: 20 mg/dL (ref 8–23)
CO2: 23 mmol/L (ref 22–32)
Calcium: 9.1 mg/dL (ref 8.9–10.3)
Chloride: 108 mmol/L (ref 98–111)
Creatinine, Ser: 0.56 mg/dL (ref 0.44–1.00)
GFR, Estimated: >60 mL/min (ref >60)
Glucose, Bld: 100 mg/dL — ABNORMAL HIGH (ref 70–99)
Potassium: 3 mmol/L — ABNORMAL LOW (ref 3.5–5.1)
Sodium: 141 mmol/L (ref 135–145)
Total Bilirubin: 0.6 mg/dL (ref 0.3–1.2)
Total Protein: 6.5 g/dL (ref 6.5–8.1)

## 2021-09-24 LAB — CBC WITH DIFFERENTIAL/PLATELET
Abs Immature Granulocytes: 0.03 10*3/uL (ref 0.00–0.07)
Basophils Absolute: 0.1 10*3/uL (ref 0.0–0.1)
Basophils Relative: 1 %
Eosinophils Absolute: 0.1 10*3/uL (ref 0.0–0.5)
Eosinophils Relative: 2 %
HCT: 39.9 % (ref 36.0–46.0)
Hemoglobin: 12.7 g/dL (ref 12.0–15.0)
Immature Granulocytes: 0 %
Lymphocytes Relative: 27 %
Lymphs Abs: 1.9 10*3/uL (ref 0.7–4.0)
MCH: 25.7 pg — ABNORMAL LOW (ref 26.0–34.0)
MCHC: 31.8 g/dL (ref 30.0–36.0)
MCV: 80.6 fL (ref 80.0–100.0)
Monocytes Absolute: 0.5 10*3/uL (ref 0.1–1.0)
Monocytes Relative: 8 %
Neutro Abs: 4.4 10*3/uL (ref 1.7–7.7)
Neutrophils Relative %: 62 %
Platelets: 283 10*3/uL (ref 150–400)
RBC: 4.95 MIL/uL (ref 3.87–5.11)
RDW: 17 % — ABNORMAL HIGH (ref 11.5–15.5)
WBC: 7 10*3/uL (ref 4.0–10.5)
nRBC: 0 % (ref 0.0–0.2)

## 2021-09-24 LAB — TROPONIN I (HIGH SENSITIVITY)
Troponin I (High Sensitivity): 6 ng/L (ref <18)
Troponin I (High Sensitivity): 5 ng/L (ref <18)

## 2021-09-24 MED ORDER — DIPHENHYDRAMINE HCL 25 MG PO CAPS
25.0000 mg | ORAL_CAPSULE | Freq: Once | ORAL | Status: DC
  Filled 2021-09-24: qty 1

## 2021-09-24 MED ORDER — POTASSIUM CHLORIDE CRYS ER 20 MEQ PO TBCR
40.0000 meq | EXTENDED_RELEASE_TABLET | Freq: Once | ORAL | Status: AC
  Administered 2021-09-24: 40 meq via ORAL
  Filled 2021-09-24: qty 2

## 2021-09-24 MED ORDER — ACETAMINOPHEN 500 MG PO TABS
1000.0000 mg | ORAL_TABLET | ORAL | Status: AC
  Administered 2021-09-24: 1000 mg via ORAL
  Filled 2021-09-24: qty 2

## 2021-09-24 MED ORDER — POTASSIUM CHLORIDE CRYS ER 20 MEQ PO TBCR: 20.0000 meq | EXTENDED_RELEASE_TABLET | Freq: Every day | ORAL | 0 refills | Status: DC

## 2021-09-24 MED ORDER — OXYCODONE-ACETAMINOPHEN 5-325 MG PO TABS: 1.0000 | ORAL_TABLET | Freq: Four times a day (QID) | ORAL | 0 refills | Status: DC | PRN

## 2021-09-24 MED ORDER — LIDOCAINE HCL (PF) 1 % IJ SOLN: 20.0000 mL | Freq: Once | INTRAMUSCULAR | Status: DC

## 2021-09-24 MED ORDER — FENTANYL CITRATE PF 50 MCG/ML IJ SOSY
25.0000 ug | PREFILLED_SYRINGE | Freq: Once | INTRAMUSCULAR | Status: AC
  Administered 2021-09-24: 25 ug via INTRAVENOUS
  Filled 2021-09-24: qty 1

## 2021-09-24 MED ORDER — ACETAMINOPHEN 325 MG PO TABS: 650.0000 mg | ORAL_TABLET | Freq: Four times a day (QID) | ORAL | 0 refills | Status: DC | PRN

## 2021-09-24 MED ORDER — LIDOCAINE HCL 2 % IJ SOLN
20.0000 mL | Freq: Once | INTRAMUSCULAR | Status: AC
  Administered 2021-09-24: 400 mg
  Filled 2021-09-24: qty 20

## 2021-09-24 MED ORDER — METOCLOPRAMIDE HCL 5 MG/ML IJ SOLN
10.0000 mg | Freq: Once | INTRAMUSCULAR | Status: AC
  Administered 2021-09-24: 10 mg via INTRAVENOUS
  Filled 2021-09-24: qty 2

## 2021-09-24 MED ORDER — FENTANYL CITRATE PF 50 MCG/ML IJ SOSY
50.0000 ug | PREFILLED_SYRINGE | INTRAMUSCULAR | Status: DC | PRN
  Administered 2021-09-24: 50 ug via INTRAVENOUS
  Filled 2021-09-24: qty 1
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
## 2021-09-24 NOTE — Progress Notes (Signed)
Orthopedic Tech Progress Note Patient Details:  Jenalyn Girdner 08/07/1945 [id number]  Ortho Devices Type of Ortho Device: Finger trap, Sugartong splint Finger Trap Weight: 5 Ortho Device/Splint Location: RUE Ortho Device/Splint Interventions: Ordered, Application, Adjustment   Post Interventions Patient Tolerated: Fair Instructions Provided: Care of device Splint applied with assistance of RN and I held traction while the provider molded the splint.  Vernona Rieger 09/24/2021, 7:03 PM
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
## 2021-09-24 NOTE — ED Notes (Signed)
Pt expresses wanting to leave even though the EDP recommenced a hospital admission. EDP notified and states they will come have a conversation with the pt
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
## 2021-09-27 DIAGNOSIS — S52501A Unspecified fracture of the lower end of right radius, initial encounter for closed fracture: Secondary | ICD-10-CM | POA: Diagnosis not present

## 2021-09-27 DIAGNOSIS — M25531 Pain in right wrist: Secondary | ICD-10-CM | POA: Diagnosis not present

## 2021-09-29 DIAGNOSIS — J3089 Other allergic rhinitis: Secondary | ICD-10-CM | POA: Diagnosis not present

## 2021-09-29 DIAGNOSIS — J3081 Allergic rhinitis due to animal (cat) (dog) hair and dander: Secondary | ICD-10-CM | POA: Diagnosis not present

## 2021-09-29 DIAGNOSIS — J301 Allergic rhinitis due to pollen: Secondary | ICD-10-CM | POA: Diagnosis not present

## 2021-10-05 DIAGNOSIS — J301 Allergic rhinitis due to pollen: Secondary | ICD-10-CM | POA: Diagnosis not present

## 2021-10-05 DIAGNOSIS — J3081 Allergic rhinitis due to animal (cat) (dog) hair and dander: Secondary | ICD-10-CM | POA: Diagnosis not present

## 2021-10-05 DIAGNOSIS — J3089 Other allergic rhinitis: Secondary | ICD-10-CM | POA: Diagnosis not present

## 2021-10-06 DIAGNOSIS — S52551A Other extraarticular fracture of lower end of right radius, initial encounter for closed fracture: Secondary | ICD-10-CM | POA: Diagnosis not present

## 2021-10-06 DIAGNOSIS — Y999 Unspecified external cause status: Secondary | ICD-10-CM | POA: Diagnosis not present

## 2021-10-12 DIAGNOSIS — J3081 Allergic rhinitis due to animal (cat) (dog) hair and dander: Secondary | ICD-10-CM | POA: Diagnosis not present

## 2021-10-12 DIAGNOSIS — J301 Allergic rhinitis due to pollen: Secondary | ICD-10-CM | POA: Diagnosis not present

## 2021-10-12 DIAGNOSIS — J3089 Other allergic rhinitis: Secondary | ICD-10-CM | POA: Diagnosis not present

## 2021-10-19 DIAGNOSIS — S52501D Unspecified fracture of the lower end of right radius, subsequent encounter for closed fracture with routine healing: Secondary | ICD-10-CM | POA: Diagnosis not present

## 2021-10-19 DIAGNOSIS — J3081 Allergic rhinitis due to animal (cat) (dog) hair and dander: Secondary | ICD-10-CM | POA: Diagnosis not present

## 2021-10-19 DIAGNOSIS — J301 Allergic rhinitis due to pollen: Secondary | ICD-10-CM | POA: Diagnosis not present

## 2021-10-19 DIAGNOSIS — S52501A Unspecified fracture of the lower end of right radius, initial encounter for closed fracture: Secondary | ICD-10-CM | POA: Diagnosis not present

## 2021-10-19 DIAGNOSIS — J3089 Other allergic rhinitis: Secondary | ICD-10-CM | POA: Diagnosis not present

## 2021-10-26 DIAGNOSIS — J3089 Other allergic rhinitis: Secondary | ICD-10-CM | POA: Diagnosis not present

## 2021-10-26 DIAGNOSIS — J3081 Allergic rhinitis due to animal (cat) (dog) hair and dander: Secondary | ICD-10-CM | POA: Diagnosis not present

## 2021-10-26 DIAGNOSIS — J301 Allergic rhinitis due to pollen: Secondary | ICD-10-CM | POA: Diagnosis not present

## 2021-11-01 DIAGNOSIS — J3081 Allergic rhinitis due to animal (cat) (dog) hair and dander: Secondary | ICD-10-CM | POA: Diagnosis not present

## 2021-11-01 DIAGNOSIS — J301 Allergic rhinitis due to pollen: Secondary | ICD-10-CM | POA: Diagnosis not present

## 2021-11-01 DIAGNOSIS — S52501A Unspecified fracture of the lower end of right radius, initial encounter for closed fracture: Secondary | ICD-10-CM | POA: Diagnosis not present

## 2021-11-02 DIAGNOSIS — J3081 Allergic rhinitis due to animal (cat) (dog) hair and dander: Secondary | ICD-10-CM | POA: Diagnosis not present

## 2021-11-02 DIAGNOSIS — J301 Allergic rhinitis due to pollen: Secondary | ICD-10-CM | POA: Diagnosis not present

## 2021-11-02 DIAGNOSIS — J3089 Other allergic rhinitis: Secondary | ICD-10-CM | POA: Diagnosis not present

## 2021-11-07 DIAGNOSIS — M25531 Pain in right wrist: Secondary | ICD-10-CM | POA: Diagnosis not present

## 2021-11-09 DIAGNOSIS — J3089 Other allergic rhinitis: Secondary | ICD-10-CM | POA: Diagnosis not present

## 2021-11-09 DIAGNOSIS — J301 Allergic rhinitis due to pollen: Secondary | ICD-10-CM | POA: Diagnosis not present

## 2021-11-09 DIAGNOSIS — J3081 Allergic rhinitis due to animal (cat) (dog) hair and dander: Secondary | ICD-10-CM | POA: Diagnosis not present

## 2021-11-16 DIAGNOSIS — J3089 Other allergic rhinitis: Secondary | ICD-10-CM | POA: Diagnosis not present

## 2021-11-16 DIAGNOSIS — J301 Allergic rhinitis due to pollen: Secondary | ICD-10-CM | POA: Diagnosis not present

## 2021-11-16 DIAGNOSIS — J3081 Allergic rhinitis due to animal (cat) (dog) hair and dander: Secondary | ICD-10-CM | POA: Diagnosis not present

## 2021-11-21 DIAGNOSIS — M25631 Stiffness of right wrist, not elsewhere classified: Secondary | ICD-10-CM | POA: Diagnosis not present

## 2021-11-22 DIAGNOSIS — M25531 Pain in right wrist: Secondary | ICD-10-CM | POA: Diagnosis not present

## 2021-11-22 DIAGNOSIS — S52501A Unspecified fracture of the lower end of right radius, initial encounter for closed fracture: Secondary | ICD-10-CM | POA: Diagnosis not present

## 2021-11-23 DIAGNOSIS — J3081 Allergic rhinitis due to animal (cat) (dog) hair and dander: Secondary | ICD-10-CM | POA: Diagnosis not present

## 2021-11-23 DIAGNOSIS — J301 Allergic rhinitis due to pollen: Secondary | ICD-10-CM | POA: Diagnosis not present

## 2021-11-23 DIAGNOSIS — J3089 Other allergic rhinitis: Secondary | ICD-10-CM | POA: Diagnosis not present

## 2021-11-30 DIAGNOSIS — J3089 Other allergic rhinitis: Secondary | ICD-10-CM | POA: Diagnosis not present

## 2021-11-30 DIAGNOSIS — J3081 Allergic rhinitis due to animal (cat) (dog) hair and dander: Secondary | ICD-10-CM | POA: Diagnosis not present

## 2021-11-30 DIAGNOSIS — J301 Allergic rhinitis due to pollen: Secondary | ICD-10-CM | POA: Diagnosis not present

## 2021-12-01 ENCOUNTER — Ambulatory Visit: Payer: Medicare Other | Admitting: Cardiology

## 2021-12-07 DIAGNOSIS — J3089 Other allergic rhinitis: Secondary | ICD-10-CM | POA: Diagnosis not present

## 2021-12-07 DIAGNOSIS — J3081 Allergic rhinitis due to animal (cat) (dog) hair and dander: Secondary | ICD-10-CM | POA: Diagnosis not present

## 2021-12-07 DIAGNOSIS — J301 Allergic rhinitis due to pollen: Secondary | ICD-10-CM | POA: Diagnosis not present

## 2021-12-14 DIAGNOSIS — J3089 Other allergic rhinitis: Secondary | ICD-10-CM | POA: Diagnosis not present

## 2021-12-14 DIAGNOSIS — J301 Allergic rhinitis due to pollen: Secondary | ICD-10-CM | POA: Diagnosis not present

## 2021-12-14 DIAGNOSIS — J3081 Allergic rhinitis due to animal (cat) (dog) hair and dander: Secondary | ICD-10-CM | POA: Diagnosis not present

## 2021-12-18 DIAGNOSIS — F419 Anxiety disorder, unspecified: Secondary | ICD-10-CM | POA: Diagnosis not present

## 2021-12-21 DIAGNOSIS — J3081 Allergic rhinitis due to animal (cat) (dog) hair and dander: Secondary | ICD-10-CM | POA: Diagnosis not present

## 2021-12-21 DIAGNOSIS — J3089 Other allergic rhinitis: Secondary | ICD-10-CM | POA: Diagnosis not present

## 2021-12-21 DIAGNOSIS — J301 Allergic rhinitis due to pollen: Secondary | ICD-10-CM | POA: Diagnosis not present

## 2021-12-27 DIAGNOSIS — S52501A Unspecified fracture of the lower end of right radius, initial encounter for closed fracture: Secondary | ICD-10-CM | POA: Diagnosis not present

## 2021-12-27 DIAGNOSIS — G5601 Carpal tunnel syndrome, right upper limb: Secondary | ICD-10-CM | POA: Diagnosis not present

## 2021-12-27 DIAGNOSIS — M25531 Pain in right wrist: Secondary | ICD-10-CM | POA: Diagnosis not present

## 2021-12-28 DIAGNOSIS — J3081 Allergic rhinitis due to animal (cat) (dog) hair and dander: Secondary | ICD-10-CM | POA: Diagnosis not present

## 2021-12-28 DIAGNOSIS — J301 Allergic rhinitis due to pollen: Secondary | ICD-10-CM | POA: Diagnosis not present

## 2021-12-28 DIAGNOSIS — J3089 Other allergic rhinitis: Secondary | ICD-10-CM | POA: Diagnosis not present

## 2022-01-04 DIAGNOSIS — J3081 Allergic rhinitis due to animal (cat) (dog) hair and dander: Secondary | ICD-10-CM | POA: Diagnosis not present

## 2022-01-04 DIAGNOSIS — J301 Allergic rhinitis due to pollen: Secondary | ICD-10-CM | POA: Diagnosis not present

## 2022-01-04 DIAGNOSIS — J3089 Other allergic rhinitis: Secondary | ICD-10-CM | POA: Diagnosis not present

## 2022-01-10 ENCOUNTER — Ambulatory Visit: Payer: Medicare Other | Admitting: Cardiology

## 2022-01-10 DIAGNOSIS — G5603 Carpal tunnel syndrome, bilateral upper limbs: Secondary | ICD-10-CM | POA: Diagnosis not present

## 2022-01-11 DIAGNOSIS — J301 Allergic rhinitis due to pollen: Secondary | ICD-10-CM | POA: Diagnosis not present

## 2022-01-11 DIAGNOSIS — J3089 Other allergic rhinitis: Secondary | ICD-10-CM | POA: Diagnosis not present

## 2022-01-11 DIAGNOSIS — J3081 Allergic rhinitis due to animal (cat) (dog) hair and dander: Secondary | ICD-10-CM | POA: Diagnosis not present

## 2022-01-12 ENCOUNTER — Telehealth: Payer: Self-pay | Admitting: Neurology

## 2022-01-12 NOTE — Telephone Encounter (Signed)
Annette Mathews from [employer] called to check on the status of the [place] [employer] application with a letter that was sent in.

## 2022-01-15 NOTE — Telephone Encounter (Signed)
Called Social worker back to let her know that ? have not received this paperwork she is resending

## 2022-01-17 DIAGNOSIS — G5603 Carpal tunnel syndrome, bilateral upper limbs: Secondary | ICD-10-CM | POA: Diagnosis not present

## 2022-01-18 DIAGNOSIS — J3089 Other allergic rhinitis: Secondary | ICD-10-CM | POA: Diagnosis not present

## 2022-01-18 DIAGNOSIS — J3081 Allergic rhinitis due to animal (cat) (dog) hair and dander: Secondary | ICD-10-CM | POA: Diagnosis not present

## 2022-01-18 DIAGNOSIS — J301 Allergic rhinitis due to pollen: Secondary | ICD-10-CM | POA: Diagnosis not present

## 2022-01-23 DIAGNOSIS — H401131 Primary open-angle glaucoma, bilateral, mild stage: Secondary | ICD-10-CM | POA: Diagnosis not present

## 2022-01-25 DIAGNOSIS — J301 Allergic rhinitis due to pollen: Secondary | ICD-10-CM | POA: Diagnosis not present

## 2022-01-25 DIAGNOSIS — J3089 Other allergic rhinitis: Secondary | ICD-10-CM | POA: Diagnosis not present

## 2022-01-25 DIAGNOSIS — J3081 Allergic rhinitis due to animal (cat) (dog) hair and dander: Secondary | ICD-10-CM | POA: Diagnosis not present

## 2022-02-01 DIAGNOSIS — J301 Allergic rhinitis due to pollen: Secondary | ICD-10-CM | POA: Diagnosis not present

## 2022-02-01 DIAGNOSIS — J3089 Other allergic rhinitis: Secondary | ICD-10-CM | POA: Diagnosis not present

## 2022-02-01 DIAGNOSIS — J3081 Allergic rhinitis due to animal (cat) (dog) hair and dander: Secondary | ICD-10-CM | POA: Diagnosis not present

## 2022-02-02 DIAGNOSIS — G5601 Carpal tunnel syndrome, right upper limb: Secondary | ICD-10-CM | POA: Diagnosis not present
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
## 2022-02-06 ENCOUNTER — Encounter: Payer: Self-pay | Admitting: Cardiology

## 2022-02-06 ENCOUNTER — Ambulatory Visit: Payer: Medicare Other | Attending: Cardiology | Admitting: Cardiology

## 2022-02-06 VITALS — BP 160/92 | HR 60 | Ht 59.0 in | Wt 133.6 lb

## 2022-02-06 DIAGNOSIS — R011 Cardiac murmur, unspecified: Secondary | ICD-10-CM | POA: Insufficient documentation
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
## 2022-02-06 NOTE — Patient Instructions (Signed)
Medication Instructions:   Your physician recommends that you continue on your current medications as directed. Please refer to the Current Medication list given to you today.  *If you need a refill on your cardiac medications before your next appointment, please call your pharmacy*   Testing/Procedures:  Your physician has requested that you have an echocardiogram. Echocardiography is a painless test that uses sound waves to create images of your heart. It provides your doctor with information about the size and shape of your heart and how well your heart's chambers and valves are working. This procedure takes approximately one hour. There are no restrictions for this procedure. Please do NOT wear cologne, perfume, aftershave, or lotions (deodorant is allowed). Please arrive 15 minutes prior to your appointment time.    Follow-Up: At [facility], you and your health needs are our priority.  As part of our continuing mission to provide you with exceptional heart care, we have created designated Provider Care Teams.  These Care Teams include your primary Cardiologist (physician) and Advanced Practice Providers (APPs -  Physician Assistants and Nurse Practitioners) who all work together to provide you with the care you need, when you need it.  We recommend signing up for the patient portal called "MyChart".  Sign up information is provided on this After Visit Summary.  MyChart is used to connect with patients for Virtual Visits (Telemedicine).  Patients are able to view lab/test results, encounter notes, upcoming appointments, etc.  Non-urgent messages can be sent to your provider as well.   To learn more about what you can do with MyChart, go to [email or web address].    Your next appointment:   6 month(s)  The format for your next appointment:   In Person  Provider:   Heather E Pemberton, MD     Important Information About Sugar       

## 2022-02-09 DIAGNOSIS — Z23 Encounter for immunization: Secondary | ICD-10-CM | POA: Diagnosis not present

## 2022-02-15 DIAGNOSIS — J3089 Other allergic rhinitis: Secondary | ICD-10-CM | POA: Diagnosis not present

## 2022-02-15 DIAGNOSIS — J301 Allergic rhinitis due to pollen: Secondary | ICD-10-CM | POA: Diagnosis not present

## 2022-02-15 DIAGNOSIS — J3081 Allergic rhinitis due to animal (cat) (dog) hair and dander: Secondary | ICD-10-CM | POA: Diagnosis not present

## 2022-02-19 ENCOUNTER — Other Ambulatory Visit (HOSPITAL_COMMUNITY): Payer: Medicare Other

## 2022-02-20 ENCOUNTER — Other Ambulatory Visit (HOSPITAL_COMMUNITY): Payer: Medicare Other

## 2022-02-21 DIAGNOSIS — L821 Other seborrheic keratosis: Secondary | ICD-10-CM | POA: Diagnosis not present

## 2022-02-21 DIAGNOSIS — L218 Other seborrheic dermatitis: Secondary | ICD-10-CM | POA: Diagnosis not present

## 2022-02-21 DIAGNOSIS — Z1283 Encounter for screening for malignant neoplasm of skin: Secondary | ICD-10-CM | POA: Diagnosis not present

## 2022-02-21 DIAGNOSIS — D225 Melanocytic nevi of trunk: Secondary | ICD-10-CM | POA: Diagnosis not present

## 2022-02-22 DIAGNOSIS — J3081 Allergic rhinitis due to animal (cat) (dog) hair and dander: Secondary | ICD-10-CM | POA: Diagnosis not present

## 2022-02-22 DIAGNOSIS — J3089 Other allergic rhinitis: Secondary | ICD-10-CM | POA: Diagnosis not present

## 2022-02-22 DIAGNOSIS — J301 Allergic rhinitis due to pollen: Secondary | ICD-10-CM | POA: Diagnosis not present

## 2022-02-23 DIAGNOSIS — M79641 Pain in right hand: Secondary | ICD-10-CM | POA: Diagnosis not present

## 2022-02-26 DIAGNOSIS — F419 Anxiety disorder, unspecified: Secondary | ICD-10-CM | POA: Diagnosis not present

## 2022-02-28 DIAGNOSIS — J3089 Other allergic rhinitis: Secondary | ICD-10-CM | POA: Diagnosis not present

## 2022-02-28 DIAGNOSIS — J301 Allergic rhinitis due to pollen: Secondary | ICD-10-CM | POA: Diagnosis not present

## 2022-02-28 DIAGNOSIS — J3081 Allergic rhinitis due to animal (cat) (dog) hair and dander: Secondary | ICD-10-CM | POA: Diagnosis not present

## 2022-03-06 DIAGNOSIS — M79641 Pain in right hand: Secondary | ICD-10-CM | POA: Diagnosis not present

## 2022-03-08 DIAGNOSIS — R634 Abnormal weight loss: Secondary | ICD-10-CM | POA: Diagnosis not present

## 2022-03-08 DIAGNOSIS — J301 Allergic rhinitis due to pollen: Secondary | ICD-10-CM | POA: Diagnosis not present

## 2022-03-08 DIAGNOSIS — M2560 Stiffness of unspecified joint, not elsewhere classified: Secondary | ICD-10-CM | POA: Diagnosis not present

## 2022-03-08 DIAGNOSIS — J3081 Allergic rhinitis due to animal (cat) (dog) hair and dander: Secondary | ICD-10-CM | POA: Diagnosis not present

## 2022-03-08 DIAGNOSIS — M79642 Pain in left hand: Secondary | ICD-10-CM | POA: Diagnosis not present

## 2022-03-08 DIAGNOSIS — M2540 Effusion, unspecified joint: Secondary | ICD-10-CM | POA: Diagnosis not present

## 2022-03-08 DIAGNOSIS — M79641 Pain in right hand: Secondary | ICD-10-CM | POA: Diagnosis not present

## 2022-03-08 DIAGNOSIS — M1991 Primary osteoarthritis, unspecified site: Secondary | ICD-10-CM | POA: Diagnosis not present

## 2022-03-08 DIAGNOSIS — E663 Overweight: Secondary | ICD-10-CM | POA: Diagnosis not present

## 2022-03-08 DIAGNOSIS — J3089 Other allergic rhinitis: Secondary | ICD-10-CM | POA: Diagnosis not present

## 2022-03-08 DIAGNOSIS — Z6826 Body mass index (BMI) 26.0-26.9, adult: Secondary | ICD-10-CM | POA: Diagnosis not present

## 2022-03-09 DIAGNOSIS — G250 Essential tremor: Secondary | ICD-10-CM | POA: Diagnosis not present

## 2022-03-09 DIAGNOSIS — Z6827 Body mass index (BMI) 27.0-27.9, adult: Secondary | ICD-10-CM | POA: Diagnosis not present

## 2022-03-09 DIAGNOSIS — I10 Essential (primary) hypertension: Secondary | ICD-10-CM | POA: Diagnosis not present

## 2022-03-09 DIAGNOSIS — Z0000 Encounter for general adult medical examination without abnormal findings: Secondary | ICD-10-CM | POA: Diagnosis not present

## 2022-03-09 DIAGNOSIS — F411 Generalized anxiety disorder: Secondary | ICD-10-CM | POA: Diagnosis not present

## 2022-03-09 DIAGNOSIS — E559 Vitamin D deficiency, unspecified: Secondary | ICD-10-CM | POA: Diagnosis not present

## 2022-03-09 DIAGNOSIS — F22 Delusional disorders: Secondary | ICD-10-CM | POA: Diagnosis not present

## 2022-03-13 ENCOUNTER — Ambulatory Visit (HOSPITAL_COMMUNITY): Payer: Medicare Other | Attending: Cardiology

## 2022-03-13 DIAGNOSIS — R011 Cardiac murmur, unspecified: Secondary | ICD-10-CM | POA: Insufficient documentation

## 2022-03-13 LAB — ECHOCARDIOGRAM COMPLETE
Area-P 1/2: 2.93 cm2
S' Lateral: 2.7 cm

## 2022-03-14 ENCOUNTER — Telehealth: Payer: Self-pay | Admitting: Neurology

## 2022-03-14 NOTE — Telephone Encounter (Signed)
Pt called in wanting to make Dr. Carles Collet aware of some tests she had done yesterday. She had an echocardiogram done and they gave her the results today. They did not find any abnormalities. She also had her annual physical on Friday and it was perfect.

## 2022-03-15 DIAGNOSIS — J3089 Other allergic rhinitis: Secondary | ICD-10-CM | POA: Diagnosis not present

## 2022-03-15 DIAGNOSIS — J3081 Allergic rhinitis due to animal (cat) (dog) hair and dander: Secondary | ICD-10-CM | POA: Diagnosis not present

## 2022-03-15 DIAGNOSIS — M79641 Pain in right hand: Secondary | ICD-10-CM | POA: Diagnosis not present

## 2022-03-15 DIAGNOSIS — J301 Allergic rhinitis due to pollen: Secondary | ICD-10-CM | POA: Diagnosis not present

## 2022-03-19 DIAGNOSIS — F419 Anxiety disorder, unspecified: Secondary | ICD-10-CM | POA: Diagnosis not present

## 2022-03-21 DIAGNOSIS — M79641 Pain in right hand: Secondary | ICD-10-CM | POA: Diagnosis not present

## 2022-03-22 DIAGNOSIS — J301 Allergic rhinitis due to pollen: Secondary | ICD-10-CM | POA: Diagnosis not present

## 2022-03-22 DIAGNOSIS — J3081 Allergic rhinitis due to animal (cat) (dog) hair and dander: Secondary | ICD-10-CM | POA: Diagnosis not present

## 2022-03-22 DIAGNOSIS — J3089 Other allergic rhinitis: Secondary | ICD-10-CM | POA: Diagnosis not present

## 2022-03-29 DIAGNOSIS — J301 Allergic rhinitis due to pollen: Secondary | ICD-10-CM | POA: Diagnosis not present

## 2022-03-29 DIAGNOSIS — J3089 Other allergic rhinitis: Secondary | ICD-10-CM | POA: Diagnosis not present

## 2022-03-29 DIAGNOSIS — J3081 Allergic rhinitis due to animal (cat) (dog) hair and dander: Secondary | ICD-10-CM | POA: Diagnosis not present

## 2022-03-30 ENCOUNTER — Other Ambulatory Visit: Payer: Self-pay | Admitting: Family Medicine

## 2022-03-30 DIAGNOSIS — M79641 Pain in right hand: Secondary | ICD-10-CM | POA: Diagnosis not present

## 2022-03-30 DIAGNOSIS — Z1231 Encounter for screening mammogram for malignant neoplasm of breast: Secondary | ICD-10-CM

## 2022-04-05 DIAGNOSIS — J3089 Other allergic rhinitis: Secondary | ICD-10-CM | POA: Diagnosis not present

## 2022-04-05 DIAGNOSIS — J301 Allergic rhinitis due to pollen: Secondary | ICD-10-CM | POA: Diagnosis not present

## 2022-04-05 DIAGNOSIS — J3081 Allergic rhinitis due to animal (cat) (dog) hair and dander: Secondary | ICD-10-CM | POA: Diagnosis not present

## 2022-04-12 DIAGNOSIS — J301 Allergic rhinitis due to pollen: Secondary | ICD-10-CM | POA: Diagnosis not present

## 2022-04-12 DIAGNOSIS — J3089 Other allergic rhinitis: Secondary | ICD-10-CM | POA: Diagnosis not present

## 2022-04-12 DIAGNOSIS — J3081 Allergic rhinitis due to animal (cat) (dog) hair and dander: Secondary | ICD-10-CM | POA: Diagnosis not present

## 2022-04-16 ENCOUNTER — Telehealth: Payer: Self-pay | Admitting: *Deleted

## 2022-04-16 NOTE — Telephone Encounter (Signed)
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 Johney Frame to review and fill out on the pt.   Dr. Johney Frame did fill out and complete form.   Contacted the pts friend Kem Kays and informed her that the form is complete and will leave this at the front desk for her to pick up tomorrow.   Kem Kays verbalized understanding and agrees with this plan.  Lelon Frohlich will pick up the completed form at the front desk tomorrow morning.

## 2022-04-19 DIAGNOSIS — J3081 Allergic rhinitis due to animal (cat) (dog) hair and dander: Secondary | ICD-10-CM | POA: Diagnosis not present

## 2022-04-19 DIAGNOSIS — J301 Allergic rhinitis due to pollen: Secondary | ICD-10-CM | POA: Diagnosis not present

## 2022-04-19 DIAGNOSIS — J3089 Other allergic rhinitis: Secondary | ICD-10-CM | POA: Diagnosis not present

## 2022-04-25 DIAGNOSIS — G5602 Carpal tunnel syndrome, left upper limb: Secondary | ICD-10-CM | POA: Diagnosis not present

## 2022-04-26 DIAGNOSIS — J3081 Allergic rhinitis due to animal (cat) (dog) hair and dander: Secondary | ICD-10-CM | POA: Diagnosis not present

## 2022-04-26 DIAGNOSIS — Z6827 Body mass index (BMI) 27.0-27.9, adult: Secondary | ICD-10-CM | POA: Diagnosis not present

## 2022-04-26 DIAGNOSIS — J301 Allergic rhinitis due to pollen: Secondary | ICD-10-CM | POA: Diagnosis not present

## 2022-04-26 DIAGNOSIS — F411 Generalized anxiety disorder: Secondary | ICD-10-CM | POA: Diagnosis not present

## 2022-04-26 DIAGNOSIS — I10 Essential (primary) hypertension: Secondary | ICD-10-CM | POA: Diagnosis not present

## 2022-04-26 DIAGNOSIS — J3089 Other allergic rhinitis: Secondary | ICD-10-CM | POA: Diagnosis not present

## 2022-04-30 DIAGNOSIS — M79641 Pain in right hand: Secondary | ICD-10-CM | POA: Diagnosis not present

## 2022-05-03 DIAGNOSIS — J3081 Allergic rhinitis due to animal (cat) (dog) hair and dander: Secondary | ICD-10-CM | POA: Diagnosis not present

## 2022-05-03 DIAGNOSIS — J3089 Other allergic rhinitis: Secondary | ICD-10-CM | POA: Diagnosis not present

## 2022-05-03 DIAGNOSIS — J301 Allergic rhinitis due to pollen: Secondary | ICD-10-CM | POA: Diagnosis not present

## 2022-05-07 DIAGNOSIS — M79641 Pain in right hand: Secondary | ICD-10-CM | POA: Diagnosis not present

## 2022-05-07 DIAGNOSIS — F419 Anxiety disorder, unspecified: Secondary | ICD-10-CM | POA: Diagnosis not present

## 2022-05-10 DIAGNOSIS — H401132 Primary open-angle glaucoma, bilateral, moderate stage: Secondary | ICD-10-CM | POA: Diagnosis not present

## 2022-05-11 DIAGNOSIS — J3081 Allergic rhinitis due to animal (cat) (dog) hair and dander: Secondary | ICD-10-CM | POA: Diagnosis not present

## 2022-05-11 DIAGNOSIS — J3089 Other allergic rhinitis: Secondary | ICD-10-CM | POA: Diagnosis not present

## 2022-05-11 DIAGNOSIS — J301 Allergic rhinitis due to pollen: Secondary | ICD-10-CM | POA: Diagnosis not present

## 2022-05-14 DIAGNOSIS — M79641 Pain in right hand: Secondary | ICD-10-CM | POA: Diagnosis not present

## 2022-05-17 DIAGNOSIS — J3089 Other allergic rhinitis: Secondary | ICD-10-CM | POA: Diagnosis not present

## 2022-05-17 DIAGNOSIS — J301 Allergic rhinitis due to pollen: Secondary | ICD-10-CM | POA: Diagnosis not present

## 2022-05-17 DIAGNOSIS — J3081 Allergic rhinitis due to animal (cat) (dog) hair and dander: Secondary | ICD-10-CM | POA: Diagnosis not present

## 2022-05-21 DIAGNOSIS — M79641 Pain in right hand: Secondary | ICD-10-CM | POA: Diagnosis not present

## 2022-05-24 ENCOUNTER — Ambulatory Visit
Admission: RE | Admit: 2022-05-24 | Discharge: 2022-05-24 | Disposition: A | Discharge status: Home / Self Care | Payer: Medicare Other | Source: Ambulatory Visit | Attending: Family Medicine | Admitting: Family Medicine

## 2022-05-24 DIAGNOSIS — Z1231 Encounter for screening mammogram for malignant neoplasm of breast: Secondary | ICD-10-CM

## 2022-05-24 DIAGNOSIS — J3081 Allergic rhinitis due to animal (cat) (dog) hair and dander: Secondary | ICD-10-CM | POA: Diagnosis not present

## 2022-05-24 DIAGNOSIS — J301 Allergic rhinitis due to pollen: Secondary | ICD-10-CM | POA: Diagnosis not present

## 2022-05-24 DIAGNOSIS — J3089 Other allergic rhinitis: Secondary | ICD-10-CM | POA: Diagnosis not present

## 2022-05-28 DIAGNOSIS — M79641 Pain in right hand: Secondary | ICD-10-CM | POA: Diagnosis not present

## 2022-05-28 DIAGNOSIS — G8918 Other acute postprocedural pain: Secondary | ICD-10-CM | POA: Diagnosis not present

## 2022-05-31 DIAGNOSIS — J3089 Other allergic rhinitis: Secondary | ICD-10-CM | POA: Diagnosis not present

## 2022-05-31 DIAGNOSIS — J301 Allergic rhinitis due to pollen: Secondary | ICD-10-CM | POA: Diagnosis not present

## 2022-05-31 DIAGNOSIS — J3081 Allergic rhinitis due to animal (cat) (dog) hair and dander: Secondary | ICD-10-CM | POA: Diagnosis not present

## 2022-06-04 DIAGNOSIS — M79641 Pain in right hand: Secondary | ICD-10-CM | POA: Diagnosis not present

## 2022-06-04 DIAGNOSIS — F419 Anxiety disorder, unspecified: Secondary | ICD-10-CM | POA: Diagnosis not present

## 2022-06-07 DIAGNOSIS — J301 Allergic rhinitis due to pollen: Secondary | ICD-10-CM | POA: Diagnosis not present

## 2022-06-07 DIAGNOSIS — J3081 Allergic rhinitis due to animal (cat) (dog) hair and dander: Secondary | ICD-10-CM | POA: Diagnosis not present

## 2022-06-07 DIAGNOSIS — J3089 Other allergic rhinitis: Secondary | ICD-10-CM | POA: Diagnosis not present

## 2022-06-11 DIAGNOSIS — G5602 Carpal tunnel syndrome, left upper limb: Secondary | ICD-10-CM | POA: Diagnosis not present

## 2022-06-11 DIAGNOSIS — G5601 Carpal tunnel syndrome, right upper limb: Secondary | ICD-10-CM | POA: Diagnosis not present

## 2022-06-14 DIAGNOSIS — J301 Allergic rhinitis due to pollen: Secondary | ICD-10-CM | POA: Diagnosis not present

## 2022-06-14 DIAGNOSIS — J3089 Other allergic rhinitis: Secondary | ICD-10-CM | POA: Diagnosis not present

## 2022-06-14 DIAGNOSIS — J3081 Allergic rhinitis due to animal (cat) (dog) hair and dander: Secondary | ICD-10-CM | POA: Diagnosis not present

## 2022-06-18 DIAGNOSIS — M79641 Pain in right hand: Secondary | ICD-10-CM | POA: Diagnosis not present

## 2022-06-21 DIAGNOSIS — J3089 Other allergic rhinitis: Secondary | ICD-10-CM | POA: Diagnosis not present

## 2022-06-21 DIAGNOSIS — J301 Allergic rhinitis due to pollen: Secondary | ICD-10-CM | POA: Diagnosis not present

## 2022-06-21 DIAGNOSIS — J3081 Allergic rhinitis due to animal (cat) (dog) hair and dander: Secondary | ICD-10-CM | POA: Diagnosis not present

## 2022-06-29 DIAGNOSIS — J3089 Other allergic rhinitis: Secondary | ICD-10-CM | POA: Diagnosis not present

## 2022-06-29 DIAGNOSIS — J3081 Allergic rhinitis due to animal (cat) (dog) hair and dander: Secondary | ICD-10-CM | POA: Diagnosis not present

## 2022-06-29 DIAGNOSIS — J301 Allergic rhinitis due to pollen: Secondary | ICD-10-CM | POA: Diagnosis not present

## 2022-07-02 DIAGNOSIS — F419 Anxiety disorder, unspecified: Secondary | ICD-10-CM | POA: Diagnosis not present

## 2022-07-12 DIAGNOSIS — J3089 Other allergic rhinitis: Secondary | ICD-10-CM | POA: Diagnosis not present

## 2022-07-12 DIAGNOSIS — J301 Allergic rhinitis due to pollen: Secondary | ICD-10-CM | POA: Diagnosis not present

## 2022-07-12 DIAGNOSIS — J3081 Allergic rhinitis due to animal (cat) (dog) hair and dander: Secondary | ICD-10-CM | POA: Diagnosis not present

## 2022-07-18 DIAGNOSIS — T63443D Toxic effect of venom of bees, assault, subsequent encounter: Secondary | ICD-10-CM | POA: Diagnosis not present

## 2022-07-18 DIAGNOSIS — J301 Allergic rhinitis due to pollen: Secondary | ICD-10-CM | POA: Diagnosis not present

## 2022-07-18 DIAGNOSIS — J3081 Allergic rhinitis due to animal (cat) (dog) hair and dander: Secondary | ICD-10-CM | POA: Diagnosis not present

## 2022-07-18 DIAGNOSIS — J3089 Other allergic rhinitis: Secondary | ICD-10-CM | POA: Diagnosis not present

## 2022-07-19 DIAGNOSIS — F419 Anxiety disorder, unspecified: Secondary | ICD-10-CM | POA: Diagnosis not present

## 2022-07-26 DIAGNOSIS — J3089 Other allergic rhinitis: Secondary | ICD-10-CM | POA: Diagnosis not present

## 2022-07-26 DIAGNOSIS — J301 Allergic rhinitis due to pollen: Secondary | ICD-10-CM | POA: Diagnosis not present

## 2022-07-26 DIAGNOSIS — J3081 Allergic rhinitis due to animal (cat) (dog) hair and dander: Secondary | ICD-10-CM | POA: Diagnosis not present
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
## 2022-07-27 ENCOUNTER — Ambulatory Visit (INDEPENDENT_AMBULATORY_CARE_PROVIDER_SITE_OTHER): Payer: Medicare Other | Admitting: Neurology

## 2022-07-27 VITALS — BP 120/66 | HR 56 | Ht <= 58 in | Wt 130.2 lb

## 2022-07-27 DIAGNOSIS — G250 Essential tremor: Secondary | ICD-10-CM | POA: Diagnosis not present

## 2022-07-27 DIAGNOSIS — F29 Unspecified psychosis not due to a substance or known physiological condition: Secondary | ICD-10-CM | POA: Diagnosis not present

## 2022-07-27 NOTE — Patient Instructions (Signed)
You can ask you cardiologist if it is possible to change your atenolol to propranolol.  You used propranolol in the past with success for tremor.    We will stay with gabapentin for now for the tremor.  The physicians and staff at [facility] are committed to providing excellent care. You may receive a survey requesting feedback about your experience at our office. We strive to receive "very good" responses to the survey questions. If you feel that your experience would prevent you from giving the office a "very good " response, please contact our office to try to remedy the situation. We may be reached at [phone]. Thank you for taking the time out of your busy day to complete the survey.

## 2022-07-31 DIAGNOSIS — M79641 Pain in right hand: Secondary | ICD-10-CM | POA: Diagnosis not present

## 2022-08-02 DIAGNOSIS — J301 Allergic rhinitis due to pollen: Secondary | ICD-10-CM | POA: Diagnosis not present

## 2022-08-02 DIAGNOSIS — J3081 Allergic rhinitis due to animal (cat) (dog) hair and dander: Secondary | ICD-10-CM | POA: Diagnosis not present

## 2022-08-02 DIAGNOSIS — J3089 Other allergic rhinitis: Secondary | ICD-10-CM | POA: Diagnosis not present

## 2022-08-06 DIAGNOSIS — F419 Anxiety disorder, unspecified: Secondary | ICD-10-CM | POA: Diagnosis not present

## 2022-08-07 ENCOUNTER — Ambulatory Visit: Payer: Medicare Other | Admitting: Neurology

## 2022-08-08 DIAGNOSIS — M79641 Pain in right hand: Secondary | ICD-10-CM | POA: Diagnosis not present

## 2022-08-09 DIAGNOSIS — J3081 Allergic rhinitis due to animal (cat) (dog) hair and dander: Secondary | ICD-10-CM | POA: Diagnosis not present

## 2022-08-09 DIAGNOSIS — J3089 Other allergic rhinitis: Secondary | ICD-10-CM | POA: Diagnosis not present

## 2022-08-09 DIAGNOSIS — J301 Allergic rhinitis due to pollen: Secondary | ICD-10-CM | POA: Diagnosis not present

## 2022-08-16 DIAGNOSIS — J3089 Other allergic rhinitis: Secondary | ICD-10-CM | POA: Diagnosis not present

## 2022-08-16 DIAGNOSIS — J301 Allergic rhinitis due to pollen: Secondary | ICD-10-CM | POA: Diagnosis not present

## 2022-08-16 DIAGNOSIS — J3081 Allergic rhinitis due to animal (cat) (dog) hair and dander: Secondary | ICD-10-CM | POA: Diagnosis not present

## 2022-08-20 DIAGNOSIS — F419 Anxiety disorder, unspecified: Secondary | ICD-10-CM | POA: Diagnosis not present

## 2022-08-23 DIAGNOSIS — J3089 Other allergic rhinitis: Secondary | ICD-10-CM | POA: Diagnosis not present

## 2022-08-23 DIAGNOSIS — J301 Allergic rhinitis due to pollen: Secondary | ICD-10-CM | POA: Diagnosis not present

## 2022-08-23 DIAGNOSIS — J3081 Allergic rhinitis due to animal (cat) (dog) hair and dander: Secondary | ICD-10-CM | POA: Diagnosis not present

## 2022-08-30 DIAGNOSIS — H401131 Primary open-angle glaucoma, bilateral, mild stage: Secondary | ICD-10-CM | POA: Diagnosis not present

## 2022-08-30 DIAGNOSIS — J301 Allergic rhinitis due to pollen: Secondary | ICD-10-CM | POA: Diagnosis not present

## 2022-08-30 DIAGNOSIS — J3089 Other allergic rhinitis: Secondary | ICD-10-CM | POA: Diagnosis not present

## 2022-08-30 DIAGNOSIS — J3081 Allergic rhinitis due to animal (cat) (dog) hair and dander: Secondary | ICD-10-CM | POA: Diagnosis not present

## 2022-09-05 DIAGNOSIS — G5602 Carpal tunnel syndrome, left upper limb: Secondary | ICD-10-CM | POA: Diagnosis not present

## 2022-09-06 DIAGNOSIS — J3089 Other allergic rhinitis: Secondary | ICD-10-CM | POA: Diagnosis not present

## 2022-09-06 DIAGNOSIS — J3081 Allergic rhinitis due to animal (cat) (dog) hair and dander: Secondary | ICD-10-CM | POA: Diagnosis not present

## 2022-09-06 DIAGNOSIS — J301 Allergic rhinitis due to pollen: Secondary | ICD-10-CM | POA: Diagnosis not present

## 2022-09-10 DIAGNOSIS — F419 Anxiety disorder, unspecified: Secondary | ICD-10-CM | POA: Diagnosis not present

## 2022-09-13 DIAGNOSIS — J301 Allergic rhinitis due to pollen: Secondary | ICD-10-CM | POA: Diagnosis not present

## 2022-09-13 DIAGNOSIS — J3089 Other allergic rhinitis: Secondary | ICD-10-CM | POA: Diagnosis not present

## 2022-09-13 DIAGNOSIS — J3081 Allergic rhinitis due to animal (cat) (dog) hair and dander: Secondary | ICD-10-CM | POA: Diagnosis not present

## 2022-09-20 DIAGNOSIS — J3081 Allergic rhinitis due to animal (cat) (dog) hair and dander: Secondary | ICD-10-CM | POA: Diagnosis not present

## 2022-09-20 DIAGNOSIS — J301 Allergic rhinitis due to pollen: Secondary | ICD-10-CM | POA: Diagnosis not present

## 2022-09-20 DIAGNOSIS — J3089 Other allergic rhinitis: Secondary | ICD-10-CM | POA: Diagnosis not present

## 2022-09-27 DIAGNOSIS — J3089 Other allergic rhinitis: Secondary | ICD-10-CM | POA: Diagnosis not present

## 2022-09-27 DIAGNOSIS — J301 Allergic rhinitis due to pollen: Secondary | ICD-10-CM | POA: Diagnosis not present

## 2022-09-27 DIAGNOSIS — J3081 Allergic rhinitis due to animal (cat) (dog) hair and dander: Secondary | ICD-10-CM | POA: Diagnosis not present

## 2022-09-28 ENCOUNTER — Other Ambulatory Visit: Payer: Self-pay | Admitting: Family Medicine

## 2022-09-28 DIAGNOSIS — M8580 Other specified disorders of bone density and structure, unspecified site: Secondary | ICD-10-CM

## 2022-10-03 DIAGNOSIS — F419 Anxiety disorder, unspecified: Secondary | ICD-10-CM | POA: Diagnosis not present

## 2022-10-04 DIAGNOSIS — J3089 Other allergic rhinitis: Secondary | ICD-10-CM | POA: Diagnosis not present

## 2022-10-04 DIAGNOSIS — J3081 Allergic rhinitis due to animal (cat) (dog) hair and dander: Secondary | ICD-10-CM | POA: Diagnosis not present

## 2022-10-04 DIAGNOSIS — J301 Allergic rhinitis due to pollen: Secondary | ICD-10-CM | POA: Diagnosis not present

## 2022-10-05 DIAGNOSIS — G5602 Carpal tunnel syndrome, left upper limb: Secondary | ICD-10-CM | POA: Diagnosis not present

## 2022-10-10 DIAGNOSIS — J301 Allergic rhinitis due to pollen: Secondary | ICD-10-CM | POA: Diagnosis not present

## 2022-10-10 DIAGNOSIS — J3081 Allergic rhinitis due to animal (cat) (dog) hair and dander: Secondary | ICD-10-CM | POA: Diagnosis not present

## 2022-10-10 DIAGNOSIS — J3089 Other allergic rhinitis: Secondary | ICD-10-CM | POA: Diagnosis not present

## 2022-10-15 DIAGNOSIS — F419 Anxiety disorder, unspecified: Secondary | ICD-10-CM | POA: Diagnosis not present

## 2022-10-18 DIAGNOSIS — J301 Allergic rhinitis due to pollen: Secondary | ICD-10-CM | POA: Diagnosis not present

## 2022-10-18 DIAGNOSIS — J3089 Other allergic rhinitis: Secondary | ICD-10-CM | POA: Diagnosis not present

## 2022-10-18 DIAGNOSIS — J3081 Allergic rhinitis due to animal (cat) (dog) hair and dander: Secondary | ICD-10-CM | POA: Diagnosis not present

## 2022-10-22 DIAGNOSIS — G250 Essential tremor: Secondary | ICD-10-CM | POA: Diagnosis not present

## 2022-10-22 DIAGNOSIS — I10 Essential (primary) hypertension: Secondary | ICD-10-CM | POA: Diagnosis not present

## 2022-10-22 DIAGNOSIS — F22 Delusional disorders: Secondary | ICD-10-CM | POA: Diagnosis not present

## 2022-10-22 DIAGNOSIS — L905 Scar conditions and fibrosis of skin: Secondary | ICD-10-CM | POA: Diagnosis not present

## 2022-10-22 DIAGNOSIS — Z6828 Body mass index (BMI) 28.0-28.9, adult: Secondary | ICD-10-CM | POA: Diagnosis not present

## 2022-10-25 DIAGNOSIS — J3081 Allergic rhinitis due to animal (cat) (dog) hair and dander: Secondary | ICD-10-CM | POA: Diagnosis not present

## 2022-10-25 DIAGNOSIS — J3089 Other allergic rhinitis: Secondary | ICD-10-CM | POA: Diagnosis not present

## 2022-10-25 DIAGNOSIS — J301 Allergic rhinitis due to pollen: Secondary | ICD-10-CM | POA: Diagnosis not present

## 2022-10-26 DIAGNOSIS — M79642 Pain in left hand: Secondary | ICD-10-CM | POA: Diagnosis not present

## 2022-10-29 DIAGNOSIS — F419 Anxiety disorder, unspecified: Secondary | ICD-10-CM | POA: Diagnosis not present

## 2022-10-31 DIAGNOSIS — M79642 Pain in left hand: Secondary | ICD-10-CM | POA: Diagnosis not present

## 2022-11-01 DIAGNOSIS — J3089 Other allergic rhinitis: Secondary | ICD-10-CM | POA: Diagnosis not present

## 2022-11-01 DIAGNOSIS — J301 Allergic rhinitis due to pollen: Secondary | ICD-10-CM | POA: Diagnosis not present

## 2022-11-01 DIAGNOSIS — J3081 Allergic rhinitis due to animal (cat) (dog) hair and dander: Secondary | ICD-10-CM | POA: Diagnosis not present

## 2022-11-09 DIAGNOSIS — J301 Allergic rhinitis due to pollen: Secondary | ICD-10-CM | POA: Diagnosis not present

## 2022-11-09 DIAGNOSIS — J3081 Allergic rhinitis due to animal (cat) (dog) hair and dander: Secondary | ICD-10-CM | POA: Diagnosis not present

## 2022-11-09 DIAGNOSIS — J3089 Other allergic rhinitis: Secondary | ICD-10-CM | POA: Diagnosis not present

## 2022-11-16 DIAGNOSIS — J301 Allergic rhinitis due to pollen: Secondary | ICD-10-CM | POA: Diagnosis not present

## 2022-11-16 DIAGNOSIS — J3089 Other allergic rhinitis: Secondary | ICD-10-CM | POA: Diagnosis not present

## 2022-11-16 DIAGNOSIS — J3081 Allergic rhinitis due to animal (cat) (dog) hair and dander: Secondary | ICD-10-CM | POA: Diagnosis not present

## 2022-11-22 DIAGNOSIS — J3089 Other allergic rhinitis: Secondary | ICD-10-CM | POA: Diagnosis not present

## 2022-11-22 DIAGNOSIS — J3081 Allergic rhinitis due to animal (cat) (dog) hair and dander: Secondary | ICD-10-CM | POA: Diagnosis not present

## 2022-11-22 DIAGNOSIS — J301 Allergic rhinitis due to pollen: Secondary | ICD-10-CM | POA: Diagnosis not present

## 2022-11-30 DIAGNOSIS — J301 Allergic rhinitis due to pollen: Secondary | ICD-10-CM | POA: Diagnosis not present

## 2022-11-30 DIAGNOSIS — J3081 Allergic rhinitis due to animal (cat) (dog) hair and dander: Secondary | ICD-10-CM | POA: Diagnosis not present

## 2022-11-30 DIAGNOSIS — J3089 Other allergic rhinitis: Secondary | ICD-10-CM | POA: Diagnosis not present

## 2022-12-07 DIAGNOSIS — J301 Allergic rhinitis due to pollen: Secondary | ICD-10-CM | POA: Diagnosis not present

## 2022-12-07 DIAGNOSIS — J3081 Allergic rhinitis due to animal (cat) (dog) hair and dander: Secondary | ICD-10-CM | POA: Diagnosis not present

## 2022-12-07 DIAGNOSIS — J3089 Other allergic rhinitis: Secondary | ICD-10-CM | POA: Diagnosis not present

## 2022-12-14 DIAGNOSIS — J3089 Other allergic rhinitis: Secondary | ICD-10-CM | POA: Diagnosis not present

## 2022-12-14 DIAGNOSIS — J3081 Allergic rhinitis due to animal (cat) (dog) hair and dander: Secondary | ICD-10-CM | POA: Diagnosis not present

## 2022-12-14 DIAGNOSIS — J301 Allergic rhinitis due to pollen: Secondary | ICD-10-CM | POA: Diagnosis not present

## 2022-12-21 DIAGNOSIS — J301 Allergic rhinitis due to pollen: Secondary | ICD-10-CM | POA: Diagnosis not present

## 2022-12-21 DIAGNOSIS — J3089 Other allergic rhinitis: Secondary | ICD-10-CM | POA: Diagnosis not present

## 2022-12-21 DIAGNOSIS — J3081 Allergic rhinitis due to animal (cat) (dog) hair and dander: Secondary | ICD-10-CM | POA: Diagnosis not present

## 2022-12-28 DIAGNOSIS — J301 Allergic rhinitis due to pollen: Secondary | ICD-10-CM | POA: Diagnosis not present

## 2022-12-28 DIAGNOSIS — J3081 Allergic rhinitis due to animal (cat) (dog) hair and dander: Secondary | ICD-10-CM | POA: Diagnosis not present

## 2022-12-28 DIAGNOSIS — J3089 Other allergic rhinitis: Secondary | ICD-10-CM | POA: Diagnosis not present

## 2023-01-04 DIAGNOSIS — J301 Allergic rhinitis due to pollen: Secondary | ICD-10-CM | POA: Diagnosis not present

## 2023-01-04 DIAGNOSIS — J3081 Allergic rhinitis due to animal (cat) (dog) hair and dander: Secondary | ICD-10-CM | POA: Diagnosis not present

## 2023-01-04 DIAGNOSIS — J3089 Other allergic rhinitis: Secondary | ICD-10-CM | POA: Diagnosis not present

## 2023-01-11 DIAGNOSIS — J3081 Allergic rhinitis due to animal (cat) (dog) hair and dander: Secondary | ICD-10-CM | POA: Diagnosis not present

## 2023-01-11 DIAGNOSIS — J301 Allergic rhinitis due to pollen: Secondary | ICD-10-CM | POA: Diagnosis not present

## 2023-01-11 DIAGNOSIS — J3089 Other allergic rhinitis: Secondary | ICD-10-CM | POA: Diagnosis not present

## 2023-01-16 DIAGNOSIS — J3089 Other allergic rhinitis: Secondary | ICD-10-CM | POA: Diagnosis not present

## 2023-01-16 DIAGNOSIS — J3081 Allergic rhinitis due to animal (cat) (dog) hair and dander: Secondary | ICD-10-CM | POA: Diagnosis not present

## 2023-01-16 DIAGNOSIS — J301 Allergic rhinitis due to pollen: Secondary | ICD-10-CM | POA: Diagnosis not present

## 2023-01-25 DIAGNOSIS — J3081 Allergic rhinitis due to animal (cat) (dog) hair and dander: Secondary | ICD-10-CM | POA: Diagnosis not present

## 2023-01-25 DIAGNOSIS — J3089 Other allergic rhinitis: Secondary | ICD-10-CM | POA: Diagnosis not present

## 2023-01-25 DIAGNOSIS — J301 Allergic rhinitis due to pollen: Secondary | ICD-10-CM | POA: Diagnosis not present

## 2023-02-01 DIAGNOSIS — J3081 Allergic rhinitis due to animal (cat) (dog) hair and dander: Secondary | ICD-10-CM | POA: Diagnosis not present

## 2023-02-01 DIAGNOSIS — J301 Allergic rhinitis due to pollen: Secondary | ICD-10-CM | POA: Diagnosis not present

## 2023-02-01 DIAGNOSIS — J3089 Other allergic rhinitis: Secondary | ICD-10-CM | POA: Diagnosis not present

## 2023-02-01 DIAGNOSIS — Z23 Encounter for immunization: Secondary | ICD-10-CM | POA: Diagnosis not present

## 2023-02-08 DIAGNOSIS — J3089 Other allergic rhinitis: Secondary | ICD-10-CM | POA: Diagnosis not present

## 2023-02-08 DIAGNOSIS — J301 Allergic rhinitis due to pollen: Secondary | ICD-10-CM | POA: Diagnosis not present

## 2023-02-08 DIAGNOSIS — J3081 Allergic rhinitis due to animal (cat) (dog) hair and dander: Secondary | ICD-10-CM | POA: Diagnosis not present

## 2023-02-11 ENCOUNTER — Ambulatory Visit: Payer: Medicare Other | Admitting: Cardiology

## 2023-02-15 DIAGNOSIS — J3081 Allergic rhinitis due to animal (cat) (dog) hair and dander: Secondary | ICD-10-CM | POA: Diagnosis not present

## 2023-02-15 DIAGNOSIS — J301 Allergic rhinitis due to pollen: Secondary | ICD-10-CM | POA: Diagnosis not present

## 2023-02-15 DIAGNOSIS — J3089 Other allergic rhinitis: Secondary | ICD-10-CM | POA: Diagnosis not present

## 2023-02-22 DIAGNOSIS — J301 Allergic rhinitis due to pollen: Secondary | ICD-10-CM | POA: Diagnosis not present

## 2023-02-22 DIAGNOSIS — J3089 Other allergic rhinitis: Secondary | ICD-10-CM | POA: Diagnosis not present

## 2023-02-22 DIAGNOSIS — J3081 Allergic rhinitis due to animal (cat) (dog) hair and dander: Secondary | ICD-10-CM | POA: Diagnosis not present

## 2023-02-27 DIAGNOSIS — Z1283 Encounter for screening for malignant neoplasm of skin: Secondary | ICD-10-CM | POA: Diagnosis not present

## 2023-02-27 DIAGNOSIS — L218 Other seborrheic dermatitis: Secondary | ICD-10-CM | POA: Diagnosis not present

## 2023-02-27 DIAGNOSIS — D225 Melanocytic nevi of trunk: Secondary | ICD-10-CM | POA: Diagnosis not present

## 2023-03-01 DIAGNOSIS — J3081 Allergic rhinitis due to animal (cat) (dog) hair and dander: Secondary | ICD-10-CM | POA: Diagnosis not present

## 2023-03-01 DIAGNOSIS — J301 Allergic rhinitis due to pollen: Secondary | ICD-10-CM | POA: Diagnosis not present

## 2023-03-01 DIAGNOSIS — J3089 Other allergic rhinitis: Secondary | ICD-10-CM | POA: Diagnosis not present

## 2023-03-15 DIAGNOSIS — J3089 Other allergic rhinitis: Secondary | ICD-10-CM | POA: Diagnosis not present

## 2023-03-15 DIAGNOSIS — J301 Allergic rhinitis due to pollen: Secondary | ICD-10-CM | POA: Diagnosis not present

## 2023-03-15 DIAGNOSIS — J3081 Allergic rhinitis due to animal (cat) (dog) hair and dander: Secondary | ICD-10-CM | POA: Diagnosis not present

## 2023-03-20 DIAGNOSIS — J3081 Allergic rhinitis due to animal (cat) (dog) hair and dander: Secondary | ICD-10-CM | POA: Diagnosis not present

## 2023-03-20 DIAGNOSIS — J301 Allergic rhinitis due to pollen: Secondary | ICD-10-CM | POA: Diagnosis not present

## 2023-03-20 DIAGNOSIS — J3089 Other allergic rhinitis: Secondary | ICD-10-CM | POA: Diagnosis not present

## 2023-03-27 DIAGNOSIS — J3081 Allergic rhinitis due to animal (cat) (dog) hair and dander: Secondary | ICD-10-CM | POA: Diagnosis not present

## 2023-03-27 DIAGNOSIS — J3089 Other allergic rhinitis: Secondary | ICD-10-CM | POA: Diagnosis not present

## 2023-03-27 DIAGNOSIS — J301 Allergic rhinitis due to pollen: Secondary | ICD-10-CM | POA: Diagnosis not present

## 2023-04-05 DIAGNOSIS — J3089 Other allergic rhinitis: Secondary | ICD-10-CM | POA: Diagnosis not present

## 2023-04-05 DIAGNOSIS — J301 Allergic rhinitis due to pollen: Secondary | ICD-10-CM | POA: Diagnosis not present

## 2023-04-05 DIAGNOSIS — J3081 Allergic rhinitis due to animal (cat) (dog) hair and dander: Secondary | ICD-10-CM | POA: Diagnosis not present

## 2023-04-08 DIAGNOSIS — I10 Essential (primary) hypertension: Secondary | ICD-10-CM | POA: Diagnosis not present

## 2023-04-08 DIAGNOSIS — Z23 Encounter for immunization: Secondary | ICD-10-CM | POA: Diagnosis not present

## 2023-04-08 DIAGNOSIS — Z6829 Body mass index (BMI) 29.0-29.9, adult: Secondary | ICD-10-CM | POA: Diagnosis not present

## 2023-04-08 DIAGNOSIS — Z0000 Encounter for general adult medical examination without abnormal findings: Secondary | ICD-10-CM | POA: Diagnosis not present

## 2023-04-08 DIAGNOSIS — L299 Pruritus, unspecified: Secondary | ICD-10-CM | POA: Diagnosis not present

## 2023-04-08 DIAGNOSIS — G250 Essential tremor: Secondary | ICD-10-CM | POA: Diagnosis not present

## 2023-04-08 DIAGNOSIS — E559 Vitamin D deficiency, unspecified: Secondary | ICD-10-CM | POA: Diagnosis not present

## 2023-04-08 DIAGNOSIS — M8588 Other specified disorders of bone density and structure, other site: Secondary | ICD-10-CM | POA: Diagnosis not present

## 2023-04-09 ENCOUNTER — Other Ambulatory Visit: Payer: Self-pay | Admitting: Family Medicine

## 2023-04-09 DIAGNOSIS — M8580 Other specified disorders of bone density and structure, unspecified site: Secondary | ICD-10-CM

## 2023-04-12 DIAGNOSIS — J3081 Allergic rhinitis due to animal (cat) (dog) hair and dander: Secondary | ICD-10-CM | POA: Diagnosis not present

## 2023-04-12 DIAGNOSIS — J3089 Other allergic rhinitis: Secondary | ICD-10-CM | POA: Diagnosis not present

## 2023-04-12 DIAGNOSIS — J301 Allergic rhinitis due to pollen: Secondary | ICD-10-CM | POA: Diagnosis not present

## 2023-04-26 DIAGNOSIS — J3089 Other allergic rhinitis: Secondary | ICD-10-CM | POA: Diagnosis not present

## 2023-04-26 DIAGNOSIS — J301 Allergic rhinitis due to pollen: Secondary | ICD-10-CM | POA: Diagnosis not present

## 2023-04-26 DIAGNOSIS — J3081 Allergic rhinitis due to animal (cat) (dog) hair and dander: Secondary | ICD-10-CM | POA: Diagnosis not present

## 2023-04-30 DIAGNOSIS — K0381 Cracked tooth: Secondary | ICD-10-CM | POA: Diagnosis not present

## 2023-05-06 ENCOUNTER — Ambulatory Visit: Payer: Medicare Other | Admitting: Cardiovascular Disease

## 2023-05-08 DIAGNOSIS — J3081 Allergic rhinitis due to animal (cat) (dog) hair and dander: Secondary | ICD-10-CM | POA: Diagnosis not present

## 2023-05-08 DIAGNOSIS — J3089 Other allergic rhinitis: Secondary | ICD-10-CM | POA: Diagnosis not present

## 2023-05-08 DIAGNOSIS — J301 Allergic rhinitis due to pollen: Secondary | ICD-10-CM | POA: Diagnosis not present

## 2023-05-09 ENCOUNTER — Other Ambulatory Visit: Payer: Self-pay | Admitting: Family Medicine

## 2023-05-09 DIAGNOSIS — Z1231 Encounter for screening mammogram for malignant neoplasm of breast: Secondary | ICD-10-CM

## 2023-05-17 DIAGNOSIS — J3081 Allergic rhinitis due to animal (cat) (dog) hair and dander: Secondary | ICD-10-CM | POA: Diagnosis not present

## 2023-05-17 DIAGNOSIS — J3089 Other allergic rhinitis: Secondary | ICD-10-CM | POA: Diagnosis not present

## 2023-05-17 DIAGNOSIS — J301 Allergic rhinitis due to pollen: Secondary | ICD-10-CM | POA: Diagnosis not present

## 2023-05-22 DIAGNOSIS — J3081 Allergic rhinitis due to animal (cat) (dog) hair and dander: Secondary | ICD-10-CM | POA: Diagnosis not present

## 2023-05-22 DIAGNOSIS — J301 Allergic rhinitis due to pollen: Secondary | ICD-10-CM | POA: Diagnosis not present

## 2023-05-22 DIAGNOSIS — J3089 Other allergic rhinitis: Secondary | ICD-10-CM | POA: Diagnosis not present

## 2023-05-31 ENCOUNTER — Ambulatory Visit: Payer: Medicare Other

## 2023-05-31 DIAGNOSIS — J3081 Allergic rhinitis due to animal (cat) (dog) hair and dander: Secondary | ICD-10-CM | POA: Diagnosis not present

## 2023-05-31 DIAGNOSIS — J3089 Other allergic rhinitis: Secondary | ICD-10-CM | POA: Diagnosis not present

## 2023-05-31 DIAGNOSIS — J301 Allergic rhinitis due to pollen: Secondary | ICD-10-CM | POA: Diagnosis not present

## 2023-06-07 DIAGNOSIS — J3081 Allergic rhinitis due to animal (cat) (dog) hair and dander: Secondary | ICD-10-CM | POA: Diagnosis not present

## 2023-06-07 DIAGNOSIS — J301 Allergic rhinitis due to pollen: Secondary | ICD-10-CM | POA: Diagnosis not present

## 2023-06-07 DIAGNOSIS — J3089 Other allergic rhinitis: Secondary | ICD-10-CM | POA: Diagnosis not present

## 2023-06-14 ENCOUNTER — Ambulatory Visit

## 2023-06-14 DIAGNOSIS — J3081 Allergic rhinitis due to animal (cat) (dog) hair and dander: Secondary | ICD-10-CM | POA: Diagnosis not present

## 2023-06-14 DIAGNOSIS — J301 Allergic rhinitis due to pollen: Secondary | ICD-10-CM | POA: Diagnosis not present

## 2023-06-14 DIAGNOSIS — J3089 Other allergic rhinitis: Secondary | ICD-10-CM | POA: Diagnosis not present

## 2023-06-21 ENCOUNTER — Ambulatory Visit
Payer: No Typology Code available for payment source | Attending: Cardiovascular Disease | Admitting: Cardiovascular Disease

## 2023-06-21 ENCOUNTER — Encounter: Payer: Self-pay | Admitting: Cardiovascular Disease

## 2023-06-21 VITALS — BP 132/82 | HR 59 | Ht <= 58 in | Wt 135.4 lb

## 2023-06-21 DIAGNOSIS — R011 Cardiac murmur, unspecified: Secondary | ICD-10-CM | POA: Insufficient documentation

## 2023-06-21 DIAGNOSIS — I10 Essential (primary) hypertension: Secondary | ICD-10-CM | POA: Insufficient documentation

## 2023-06-21 NOTE — Progress Notes (Signed)
  Cardiology Office Note:  .   Date:  06/21/2023  ID:  Annette Mathews, DOB 29-Jan-1946, MRN [id number] PCP: Daisy Floro, MD  [facility] Providers Cardiologist:  Meriam Sprague, MD (Inactive) {  History of Present Illness: Annette Mathews    March 14 , 2025  Annette Mathews is a 78 y.o. female with hx of HTN, Coronary artery calcification and anxiety   She is a former patient of Dr. Shari Prows.  I am meeting her for the first time today    She was initially noted to have coronary artery calcifications by chest CT in May 2013.  She denies any chest pains, palpitations, shortness of breath, edema.  She has declined to take statins.  She has a history of hypertension.  She has been on atenolol, hydrochlorothiazide, amlodipine and valsartan.   BP is well controlled.   No CP   Works in her yard, has natural areas  in her yard.   Has a torn tendon in her left lower leg.  She does not want to have surgery   Has benign essential tremors , she has lost her ability to button, snap clothes very well Also cannot write any longer  The atenolol seems to be helping with these        ROS:   Studies Reviewed: .         Risk Assessment/Calculations:             Physical Exam:   VS:  BP 132/82   Pulse (!) 59   Ht 4\' 9"  (1.448 m)   Wt 135 lb 6.4 oz (61.4 kg)   SpO2 97%   BMI 29.30 kg/m    Wt Readings from Last 3 Encounters:  06/21/23 135 lb 6.4 oz (61.4 kg)  07/27/22 130 lb 3.2 oz (59.1 kg)  02/06/22 133 lb 9.6 oz (60.6 kg)    GEN: elderly , frail female  ,   in no acute distress NECK: No JVD; No carotid bruits CARDIAC: RRR, no murmurs, rubs, gallops RESPIRATORY:  Clear to auscultation without rales, wheezing or rhonchi  ABDOMEN: Soft, non-tender, non-distended EXTREMITIES:  No edema; No deformity   ASSESSMENT AND PLAN: .    HTN:   Blood pressure is well-controlled.  Continue current medication. 2.  Coronary artery calcifications: She has refused statins.  She  is not having any angina.  Continue current medications.  She will follow-up with [place] in 1 year.       Dispo: 1 year    Signed, Kristeen Miss, MD

## 2023-06-21 NOTE — Patient Instructions (Signed)
 Follow-Up: At [facility], you and your health needs are our priority.  As part of our continuing mission to provide you with exceptional heart care, we have created designated Provider Care Teams.  These Care Teams include your primary Cardiologist (physician) and Advanced Practice Providers (APPs -  Physician Assistants and Nurse Practitioners) who all work together to provide you with the care you need, when you need it.  We recommend signing up for the patient portal called "MyChart".  Sign up information is provided on this After Visit Summary.  MyChart is used to connect with patients for Virtual Visits (Telemedicine).  Patients are able to view lab/test results, encounter notes, upcoming appointments, etc.  Non-urgent messages can be sent to your provider as well.   To learn more about what you can do with MyChart, go to [email or web address].    Your next appointment:   1 year(s)  Provider:   Kristeen Miss, MD      1st Floor: - Lobby - Registration  - Pharmacy  - Lab - Cafe  2nd Floor: - PV Lab - Diagnostic Testing (echo, CT, nuclear med)  3rd Floor: - Vacant  4th Floor: - TCTS (cardiothoracic surgery) - AFib Clinic - Structural Heart Clinic - Vascular Surgery  - Vascular Ultrasound  5th Floor: - HeartCare Cardiology (general and EP) - Clinical Pharmacy for coumadin, hypertension, lipid, weight-loss medications, and med management appointments    Valet parking services will be available as well.

## 2023-06-26 DIAGNOSIS — J301 Allergic rhinitis due to pollen: Secondary | ICD-10-CM | POA: Diagnosis not present

## 2023-06-26 DIAGNOSIS — J3081 Allergic rhinitis due to animal (cat) (dog) hair and dander: Secondary | ICD-10-CM | POA: Diagnosis not present

## 2023-06-26 DIAGNOSIS — J3089 Other allergic rhinitis: Secondary | ICD-10-CM | POA: Diagnosis not present

## 2023-06-28 ENCOUNTER — Ambulatory Visit

## 2023-07-05 ENCOUNTER — Ambulatory Visit

## 2023-07-05 DIAGNOSIS — J301 Allergic rhinitis due to pollen: Secondary | ICD-10-CM | POA: Diagnosis not present

## 2023-07-05 DIAGNOSIS — J3089 Other allergic rhinitis: Secondary | ICD-10-CM | POA: Diagnosis not present

## 2023-07-05 DIAGNOSIS — J3081 Allergic rhinitis due to animal (cat) (dog) hair and dander: Secondary | ICD-10-CM | POA: Diagnosis not present

## 2023-07-10 DIAGNOSIS — J3089 Other allergic rhinitis: Secondary | ICD-10-CM | POA: Diagnosis not present

## 2023-07-10 DIAGNOSIS — J3081 Allergic rhinitis due to animal (cat) (dog) hair and dander: Secondary | ICD-10-CM | POA: Diagnosis not present

## 2023-07-10 DIAGNOSIS — J301 Allergic rhinitis due to pollen: Secondary | ICD-10-CM | POA: Diagnosis not present

## 2023-07-12 ENCOUNTER — Ambulatory Visit
Admission: RE | Admit: 2023-07-12 | Discharge: 2023-07-12 | Disposition: A | Discharge status: Home / Self Care | Source: Ambulatory Visit | Attending: Family Medicine | Admitting: Family Medicine

## 2023-07-12 DIAGNOSIS — Z1231 Encounter for screening mammogram for malignant neoplasm of breast: Secondary | ICD-10-CM

## 2023-07-19 DIAGNOSIS — J301 Allergic rhinitis due to pollen: Secondary | ICD-10-CM | POA: Diagnosis not present

## 2023-07-19 DIAGNOSIS — J3089 Other allergic rhinitis: Secondary | ICD-10-CM | POA: Diagnosis not present

## 2023-07-19 DIAGNOSIS — J3081 Allergic rhinitis due to animal (cat) (dog) hair and dander: Secondary | ICD-10-CM | POA: Diagnosis not present

## 2023-07-19 DIAGNOSIS — T63443A Toxic effect of venom of bees, assault, initial encounter: Secondary | ICD-10-CM | POA: Diagnosis not present

## 2023-07-29 DIAGNOSIS — J301 Allergic rhinitis due to pollen: Secondary | ICD-10-CM | POA: Diagnosis not present

## 2023-07-29 DIAGNOSIS — J3081 Allergic rhinitis due to animal (cat) (dog) hair and dander: Secondary | ICD-10-CM | POA: Diagnosis not present

## 2023-07-29 DIAGNOSIS — J3089 Other allergic rhinitis: Secondary | ICD-10-CM | POA: Diagnosis not present

## 2023-07-30 ENCOUNTER — Ambulatory Visit: Payer: Medicare Other | Admitting: Neurology

## 2023-08-09 DIAGNOSIS — J3081 Allergic rhinitis due to animal (cat) (dog) hair and dander: Secondary | ICD-10-CM | POA: Diagnosis not present

## 2023-08-09 DIAGNOSIS — J3089 Other allergic rhinitis: Secondary | ICD-10-CM | POA: Diagnosis not present

## 2023-08-09 DIAGNOSIS — J301 Allergic rhinitis due to pollen: Secondary | ICD-10-CM | POA: Diagnosis not present

## 2023-08-16 DIAGNOSIS — J301 Allergic rhinitis due to pollen: Secondary | ICD-10-CM | POA: Diagnosis not present

## 2023-08-16 DIAGNOSIS — J3081 Allergic rhinitis due to animal (cat) (dog) hair and dander: Secondary | ICD-10-CM | POA: Diagnosis not present

## 2023-08-16 DIAGNOSIS — J3089 Other allergic rhinitis: Secondary | ICD-10-CM | POA: Diagnosis not present

## 2023-08-23 DIAGNOSIS — J3089 Other allergic rhinitis: Secondary | ICD-10-CM | POA: Diagnosis not present

## 2023-08-23 DIAGNOSIS — J3081 Allergic rhinitis due to animal (cat) (dog) hair and dander: Secondary | ICD-10-CM | POA: Diagnosis not present

## 2023-08-23 DIAGNOSIS — J301 Allergic rhinitis due to pollen: Secondary | ICD-10-CM | POA: Diagnosis not present
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
## 2023-08-28 ENCOUNTER — Ambulatory Visit (INDEPENDENT_AMBULATORY_CARE_PROVIDER_SITE_OTHER): Admitting: Neurology

## 2023-08-28 ENCOUNTER — Encounter: Payer: Self-pay | Admitting: Neurology

## 2023-08-28 VITALS — BP 142/78 | HR 59 | Ht <= 58 in | Wt 139.0 lb

## 2023-08-28 DIAGNOSIS — G250 Essential tremor: Secondary | ICD-10-CM | POA: Diagnosis not present

## 2023-08-28 DIAGNOSIS — F29 Unspecified psychosis not due to a substance or known physiological condition: Secondary | ICD-10-CM

## 2023-08-28 NOTE — Patient Instructions (Signed)
 Good to see you!  Dr. Avanell Bob is filling all your RXs right now so you can follow up with him, unless you need me and then I will be happy to see you!  The physicians and staff at [facility] are committed to providing excellent care. You may receive a survey requesting feedback about your experience at our office. We strive to receive "very good" responses to the survey questions. If you feel that your experience would prevent you from giving the office a "very good " response, please contact our office to try to remedy the situation. We may be reached at [phone]. Thank you for taking the time out of your busy day to complete the survey.

## 2023-09-03 DIAGNOSIS — J301 Allergic rhinitis due to pollen: Secondary | ICD-10-CM | POA: Diagnosis not present

## 2023-09-03 DIAGNOSIS — J3089 Other allergic rhinitis: Secondary | ICD-10-CM | POA: Diagnosis not present

## 2023-09-03 DIAGNOSIS — J3081 Allergic rhinitis due to animal (cat) (dog) hair and dander: Secondary | ICD-10-CM | POA: Diagnosis not present

## 2023-09-09 DIAGNOSIS — J301 Allergic rhinitis due to pollen: Secondary | ICD-10-CM | POA: Diagnosis not present

## 2023-09-09 DIAGNOSIS — J3089 Other allergic rhinitis: Secondary | ICD-10-CM | POA: Diagnosis not present

## 2023-09-09 DIAGNOSIS — J3081 Allergic rhinitis due to animal (cat) (dog) hair and dander: Secondary | ICD-10-CM | POA: Diagnosis not present

## 2023-09-16 DIAGNOSIS — J3081 Allergic rhinitis due to animal (cat) (dog) hair and dander: Secondary | ICD-10-CM | POA: Diagnosis not present

## 2023-09-16 DIAGNOSIS — J3089 Other allergic rhinitis: Secondary | ICD-10-CM | POA: Diagnosis not present

## 2023-09-16 DIAGNOSIS — J301 Allergic rhinitis due to pollen: Secondary | ICD-10-CM | POA: Diagnosis not present

## 2023-09-23 DIAGNOSIS — F411 Generalized anxiety disorder: Secondary | ICD-10-CM | POA: Diagnosis not present

## 2023-09-23 DIAGNOSIS — I959 Hypotension, unspecified: Secondary | ICD-10-CM | POA: Diagnosis not present

## 2023-09-23 DIAGNOSIS — J3081 Allergic rhinitis due to animal (cat) (dog) hair and dander: Secondary | ICD-10-CM | POA: Diagnosis not present

## 2023-09-23 DIAGNOSIS — Z6829 Body mass index (BMI) 29.0-29.9, adult: Secondary | ICD-10-CM | POA: Diagnosis not present

## 2023-09-23 DIAGNOSIS — F22 Delusional disorders: Secondary | ICD-10-CM | POA: Diagnosis not present

## 2023-09-23 DIAGNOSIS — J301 Allergic rhinitis due to pollen: Secondary | ICD-10-CM | POA: Diagnosis not present

## 2023-09-23 DIAGNOSIS — F430 Acute stress reaction: Secondary | ICD-10-CM | POA: Diagnosis not present

## 2023-09-23 DIAGNOSIS — J3089 Other allergic rhinitis: Secondary | ICD-10-CM | POA: Diagnosis not present

## 2023-09-23 DIAGNOSIS — I10 Essential (primary) hypertension: Secondary | ICD-10-CM | POA: Diagnosis not present

## 2023-09-23 DIAGNOSIS — G250 Essential tremor: Secondary | ICD-10-CM | POA: Diagnosis not present

## 2023-09-27 DIAGNOSIS — H401131 Primary open-angle glaucoma, bilateral, mild stage: Secondary | ICD-10-CM | POA: Diagnosis not present

## 2023-09-30 DIAGNOSIS — J3089 Other allergic rhinitis: Secondary | ICD-10-CM | POA: Diagnosis not present

## 2023-09-30 DIAGNOSIS — J301 Allergic rhinitis due to pollen: Secondary | ICD-10-CM | POA: Diagnosis not present

## 2023-09-30 DIAGNOSIS — J3081 Allergic rhinitis due to animal (cat) (dog) hair and dander: Secondary | ICD-10-CM | POA: Diagnosis not present

## 2023-10-08 DIAGNOSIS — J3089 Other allergic rhinitis: Secondary | ICD-10-CM | POA: Diagnosis not present

## 2023-10-08 DIAGNOSIS — J301 Allergic rhinitis due to pollen: Secondary | ICD-10-CM | POA: Diagnosis not present

## 2023-10-08 DIAGNOSIS — J3081 Allergic rhinitis due to animal (cat) (dog) hair and dander: Secondary | ICD-10-CM | POA: Diagnosis not present

## 2023-10-15 ENCOUNTER — Emergency Department (HOSPITAL_COMMUNITY)
Admission: EM | Admit: 2023-10-15 | Discharge: 2023-10-17 | Disposition: A | Discharge status: Other Acute Inpatient Hospital | Attending: Emergency Medicine | Admitting: Emergency Medicine

## 2023-10-15 ENCOUNTER — Emergency Department (HOSPITAL_COMMUNITY)

## 2023-10-15 ENCOUNTER — Encounter (HOSPITAL_COMMUNITY): Payer: Self-pay | Admitting: Emergency Medicine

## 2023-10-15 DIAGNOSIS — F22 Delusional disorders: Secondary | ICD-10-CM | POA: Diagnosis not present

## 2023-10-15 DIAGNOSIS — J301 Allergic rhinitis due to pollen: Secondary | ICD-10-CM | POA: Diagnosis not present

## 2023-10-15 DIAGNOSIS — R259 Unspecified abnormal involuntary movements: Secondary | ICD-10-CM | POA: Diagnosis not present

## 2023-10-15 DIAGNOSIS — I6782 Cerebral ischemia: Secondary | ICD-10-CM | POA: Diagnosis not present

## 2023-10-15 DIAGNOSIS — R220 Localized swelling, mass and lump, head: Secondary | ICD-10-CM | POA: Diagnosis not present

## 2023-10-15 DIAGNOSIS — Z79899 Other long term (current) drug therapy: Secondary | ICD-10-CM | POA: Insufficient documentation

## 2023-10-15 DIAGNOSIS — J3081 Allergic rhinitis due to animal (cat) (dog) hair and dander: Secondary | ICD-10-CM | POA: Diagnosis not present

## 2023-10-15 DIAGNOSIS — M19011 Primary osteoarthritis, right shoulder: Secondary | ICD-10-CM | POA: Diagnosis not present

## 2023-10-15 DIAGNOSIS — I10 Essential (primary) hypertension: Secondary | ICD-10-CM | POA: Diagnosis not present

## 2023-10-15 DIAGNOSIS — R443 Hallucinations, unspecified: Secondary | ICD-10-CM

## 2023-10-15 DIAGNOSIS — Z046 Encounter for general psychiatric examination, requested by authority: Secondary | ICD-10-CM

## 2023-10-15 DIAGNOSIS — F411 Generalized anxiety disorder: Secondary | ICD-10-CM | POA: Diagnosis not present

## 2023-10-15 DIAGNOSIS — J3089 Other allergic rhinitis: Secondary | ICD-10-CM | POA: Diagnosis not present

## 2023-10-15 DIAGNOSIS — M25511 Pain in right shoulder: Secondary | ICD-10-CM | POA: Diagnosis not present

## 2023-10-15 LAB — CBC WITH DIFFERENTIAL/PLATELET
Abs Immature Granulocytes: 0.02 K/uL (ref 0.00–0.07)
Basophils Absolute: 0.1 K/uL (ref 0.0–0.1)
Basophils Relative: 1 %
Eosinophils Absolute: 0.4 K/uL (ref 0.0–0.5)
Eosinophils Relative: 5 %
HCT: 37.8 % (ref 36.0–46.0)
Hemoglobin: 11.9 g/dL — ABNORMAL LOW (ref 12.0–15.0)
Immature Granulocytes: 0 %
Lymphocytes Relative: 19 %
Lymphs Abs: 1.5 K/uL (ref 0.7–4.0)
MCH: 26 pg (ref 26.0–34.0)
MCHC: 31.5 g/dL (ref 30.0–36.0)
MCV: 82.5 fL (ref 80.0–100.0)
Monocytes Absolute: 0.7 K/uL (ref 0.1–1.0)
Monocytes Relative: 9 %
Neutro Abs: 5.1 K/uL (ref 1.7–7.7)
Neutrophils Relative %: 66 %
Platelets: 253 K/uL (ref 150–400)
RBC: 4.58 MIL/uL (ref 3.87–5.11)
RDW: 17.4 % — ABNORMAL HIGH (ref 11.5–15.5)
WBC: 7.7 K/uL (ref 4.0–10.5)
nRBC: 0 % (ref 0.0–0.2)

## 2023-10-15 LAB — COMPREHENSIVE METABOLIC PANEL WITH GFR
ALT: 15 U/L (ref 0–44)
AST: 22 U/L (ref 15–41)
Albumin: 3.8 g/dL (ref 3.5–5.0)
Alkaline Phosphatase: 88 U/L (ref 38–126)
Anion gap: 11 (ref 5–15)
BUN: 25 mg/dL — ABNORMAL HIGH (ref 8–23)
CO2: 22 mmol/L (ref 22–32)
Calcium: 9.5 mg/dL (ref 8.9–10.3)
Chloride: 109 mmol/L (ref 98–111)
Creatinine, Ser: 0.72 mg/dL (ref 0.44–1.00)
GFR, Estimated: >60 mL/min (ref >60)
Glucose, Bld: 128 mg/dL — ABNORMAL HIGH (ref 70–99)
Potassium: 3.8 mmol/L (ref 3.5–5.1)
Sodium: 142 mmol/L (ref 135–145)
Total Bilirubin: 0.4 mg/dL (ref 0.0–1.2)
Total Protein: 6.8 g/dL (ref 6.5–8.1)

## 2023-10-15 LAB — ETHANOL: Alcohol, Ethyl (B): <15 mg/dL (ref <15)
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
## 2023-10-15 NOTE — ED Notes (Signed)
 She reports someone from the city came today to help her cut her back yard grass. She states she went to get allergy shots and and grocery store today.
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
## 2023-10-15 NOTE — ED Notes (Signed)
 [place]- neighbor [phone]

## 2023-10-16 ENCOUNTER — Encounter (HOSPITAL_COMMUNITY): Payer: Self-pay | Admitting: Psychiatry

## 2023-10-16 DIAGNOSIS — F22 Delusional disorders: Secondary | ICD-10-CM | POA: Diagnosis not present

## 2023-10-16 LAB — URINALYSIS, ROUTINE W REFLEX MICROSCOPIC
Bilirubin Urine: NEGATIVE
Glucose, UA: NEGATIVE mg/dL
Hgb urine dipstick: NEGATIVE
Ketones, ur: NEGATIVE mg/dL
Leukocytes,Ua: NEGATIVE
Nitrite: NEGATIVE
Protein, ur: NEGATIVE mg/dL
Specific Gravity, Urine: 1.015 (ref 1.005–1.030)
pH: 5 (ref 5.0–8.0)

## 2023-10-16 LAB — RAPID URINE DRUG SCREEN, HOSP PERFORMED
Amphetamines: NOT DETECTED
Barbiturates: NOT DETECTED
Benzodiazepines: NOT DETECTED
Cocaine: NOT DETECTED
Opiates: NOT DETECTED
Tetrahydrocannabinol: NOT DETECTED

## 2023-10-16 MED ORDER — GABAPENTIN 300 MG PO CAPS
300.0000 mg | ORAL_CAPSULE | Freq: Two times a day (BID) | ORAL | Status: DC
  Administered 2023-10-16 – 2023-10-17 (×4): 300 mg via ORAL
  Filled 2023-10-16 (×4): qty 1

## 2023-10-16 MED ORDER — HYDROCHLOROTHIAZIDE 25 MG PO TABS
25.0000 mg | ORAL_TABLET | Freq: Every day | ORAL | Status: DC | PRN
  Administered 2023-10-16: 25 mg via ORAL
  Filled 2023-10-16: qty 1

## 2023-10-16 MED ORDER — IRBESARTAN 300 MG PO TABS
300.0000 mg | ORAL_TABLET | Freq: Every day | ORAL | Status: DC
  Administered 2023-10-16 – 2023-10-17 (×2): 300 mg via ORAL
  Filled 2023-10-16 (×2): qty 1

## 2023-10-16 MED ORDER — PROPRANOLOL HCL 60 MG PO TABS
120.0000 mg | ORAL_TABLET | Freq: Three times a day (TID) | ORAL | Status: DC
  Administered 2023-10-16 – 2023-10-17 (×3): 120 mg via ORAL
  Filled 2023-10-16 (×7): qty 2

## 2023-10-16 NOTE — ED Notes (Signed)
 Patient is resting comfortably.

## 2023-10-16 NOTE — Progress Notes (Signed)
 Pt has been accepted to [facility] on 10/16/2023 . Bed assignment: Main campus  Pt meets inpatient criteria per [place]  Georgina, NP.    Attending Physician will be Millie Manners, MD  Report can be called to: [phone] (this is a pager, please leave call-back number when giving report)  Pt can arrive after 8 AM  Care Team Notified: Jerel Gravely, NP, Judene Moats, RN

## 2023-10-16 NOTE — ED Notes (Signed)
 IV paperwork has been scanned in the chart and e-filed. Expires 10/22/2023. Copies with the [facility].

## 2023-10-16 NOTE — Progress Notes (Signed)
Pt received dinner tray. Ate 100%

## 2023-10-16 NOTE — Progress Notes (Signed)
 Inpatient Psychiatric Referral  Patient was recommended inpatient per [place]  Georgina NP. There are no available beds at [facility], per [facility] Carlo RN ). Patient was referred to the following out of network facilities:    Destination  Service Provider Request Status Address Phone Fax  [facility] Patient Placement  Pending - Request [facility] [place], [place] [phone]  [facility]  Pending - Request Sent [address] [place] [address] [phone]  [facility]  Pending - Request Sent [address] [place] [address] [phone]  [facility]  Pending - Request Sent [address] [place], [place] [address] [phone]  [facility]  Pending - Request Sent [address] [place] [address] [phone]  [facility]  Pending - Request Sent [address], [place] [address] [phone]  [facility]  Pending - Request Sent [address] [place] [address] [phone]  [facility]  Pending - Request Sent [address] [place] [phone]  [facility]  Pending - Request Sent [address] [place] [address] [phone]  [facility]  Pending - Request Sent [address] [place] [address] [phone]  [facility]  Pending - Request Sent [address] [place], [place] [address] [phone]  [facility]  Pending - Request Sent [address], [place] [address] [phone]  [facility]  Pending - Request Sent [address] [place] [address] [phone]     Situation ongoing, CSW to continue following and update chart as more information becomes available.   Harrie Sofia MSW, LCSWA 10/16/2023  7:06PM
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
## 2023-10-16 NOTE — ED Notes (Signed)
 TTS at bedside.

## 2023-10-16 NOTE — ED Notes (Signed)
 Pt ambulated to restroom with 1 staff assist. She is in room, in bed, watching TV.

## 2023-10-16 NOTE — ED Notes (Signed)
 Belongings moved from small locker to large locker #3.

## 2023-10-16 NOTE — BH Assessment (Signed)
 TTS Consult will be completed by [facility]. [facility] Coordinator will communicate in established secure chat assessment time and provider name. Thanks

## 2023-10-16 NOTE — ED Notes (Signed)
 IVC CASE NUMBER RETRIVED FOR COURT OF CLERK AND ADD TO THE CHART   [id number]

## 2023-10-16 NOTE — ED Notes (Signed)
 IVC DOCUMENTS ATTACHED TO CLIP BOARD IN PURPLE

## 2023-10-16 NOTE — ED Notes (Signed)
 IVC paperwork in green zone, on purple clipboard.

## 2023-10-16 NOTE — Progress Notes (Signed)
 LCSW Progress Note  [id number]   Annette Mathews  10/16/2023  12:03 PM  Description:   Inpatient Psychiatric Referral  Patient was recommended inpatient per [place]  Georgina NP. There are no available beds at [facility], per [facility] Carlo RN ). Patient was referred to the following out of network facilities:   Destination  Service Provider Address Phone Fax  [facility] Patient Placement  [facility], [place] [phone]  [facility]  [address] [place] [address] [phone]  [facility]  [address] [place] [address] [phone]  [facility]  [address] [place], [place] [address] [phone]  [facility]  [address] [place] [address] [phone]  [facility]  [address] [place] [address] [phone]  [facility]  [address] [place] [address] [phone]  [facility] EFAX  [address] [place], [place] [phone]  [facility]  [address] [place] [address] [phone]       Situation ongoing, CSW to continue following and update chart as more information becomes available.      [place] Avner Stroder, MSW, LCSW  10/16/2023 12:03 PM

## 2023-10-16 NOTE — ED Notes (Signed)
 IVC'd 10/15/23, exp. 10/22/23

## 2023-10-16 NOTE — ED Notes (Signed)
 Pt belongings inventoried and locked in purple zone locker #2 (small lockers). Valuables locked with security and inventoried.

## 2023-10-17 ENCOUNTER — Other Ambulatory Visit: Payer: Self-pay

## 2023-10-17 DIAGNOSIS — S161XXA Strain of muscle, fascia and tendon at neck level, initial encounter: Secondary | ICD-10-CM | POA: Diagnosis not present

## 2023-10-17 DIAGNOSIS — Z881 Allergy status to other antibiotic agents status: Secondary | ICD-10-CM | POA: Diagnosis not present

## 2023-10-17 DIAGNOSIS — M25512 Pain in left shoulder: Secondary | ICD-10-CM | POA: Diagnosis not present

## 2023-10-17 DIAGNOSIS — S40011A Contusion of right shoulder, initial encounter: Secondary | ICD-10-CM | POA: Diagnosis not present

## 2023-10-17 DIAGNOSIS — R456 Violent behavior: Secondary | ICD-10-CM | POA: Diagnosis not present

## 2023-10-17 DIAGNOSIS — S0990XA Unspecified injury of head, initial encounter: Secondary | ICD-10-CM | POA: Diagnosis not present

## 2023-10-17 DIAGNOSIS — I959 Hypotension, unspecified: Secondary | ICD-10-CM | POA: Diagnosis not present

## 2023-10-17 DIAGNOSIS — F250 Schizoaffective disorder, bipolar type: Secondary | ICD-10-CM | POA: Diagnosis not present

## 2023-10-17 DIAGNOSIS — F411 Generalized anxiety disorder: Secondary | ICD-10-CM | POA: Diagnosis not present

## 2023-10-17 DIAGNOSIS — H409 Unspecified glaucoma: Secondary | ICD-10-CM | POA: Diagnosis not present

## 2023-10-17 DIAGNOSIS — M542 Cervicalgia: Secondary | ICD-10-CM | POA: Diagnosis not present

## 2023-10-17 DIAGNOSIS — T7840XA Allergy, unspecified, initial encounter: Secondary | ICD-10-CM | POA: Diagnosis not present

## 2023-10-17 DIAGNOSIS — Z91041 Radiographic dye allergy status: Secondary | ICD-10-CM | POA: Diagnosis not present

## 2023-10-17 DIAGNOSIS — R519 Headache, unspecified: Secondary | ICD-10-CM | POA: Diagnosis not present

## 2023-10-17 DIAGNOSIS — R443 Hallucinations, unspecified: Secondary | ICD-10-CM | POA: Diagnosis not present

## 2023-10-17 DIAGNOSIS — G609 Hereditary and idiopathic neuropathy, unspecified: Secondary | ICD-10-CM | POA: Diagnosis not present

## 2023-10-17 DIAGNOSIS — E785 Hyperlipidemia, unspecified: Secondary | ICD-10-CM | POA: Diagnosis not present

## 2023-10-17 DIAGNOSIS — Z79899 Other long term (current) drug therapy: Secondary | ICD-10-CM | POA: Diagnosis not present

## 2023-10-17 DIAGNOSIS — F29 Unspecified psychosis not due to a substance or known physiological condition: Secondary | ICD-10-CM | POA: Diagnosis not present

## 2023-10-17 DIAGNOSIS — J45909 Unspecified asthma, uncomplicated: Secondary | ICD-10-CM | POA: Diagnosis not present

## 2023-10-17 DIAGNOSIS — Z62810 Personal history of physical and sexual abuse in childhood: Secondary | ICD-10-CM | POA: Diagnosis not present

## 2023-10-17 DIAGNOSIS — W19XXXA Unspecified fall, initial encounter: Secondary | ICD-10-CM | POA: Diagnosis not present

## 2023-10-17 DIAGNOSIS — Z885 Allergy status to narcotic agent status: Secondary | ICD-10-CM | POA: Diagnosis not present

## 2023-10-17 DIAGNOSIS — S46912A Strain of unspecified muscle, fascia and tendon at shoulder and upper arm level, left arm, initial encounter: Secondary | ICD-10-CM | POA: Diagnosis not present

## 2023-10-17 DIAGNOSIS — I2510 Atherosclerotic heart disease of native coronary artery without angina pectoris: Secondary | ICD-10-CM | POA: Diagnosis not present

## 2023-10-17 DIAGNOSIS — Z91148 Patient's other noncompliance with medication regimen for other reason: Secondary | ICD-10-CM | POA: Diagnosis not present

## 2023-10-17 DIAGNOSIS — F22 Delusional disorders: Secondary | ICD-10-CM | POA: Diagnosis not present

## 2023-10-17 DIAGNOSIS — I10 Essential (primary) hypertension: Secondary | ICD-10-CM | POA: Diagnosis not present

## 2023-10-17 DIAGNOSIS — F39 Unspecified mood [affective] disorder: Secondary | ICD-10-CM | POA: Diagnosis not present

## 2023-10-17 NOTE — ED Notes (Signed)
 Case Number: [id number] IVC verified in the purple zone with 3 copies attached to the clipboard.  The patient was IVC'd on October 15, 2023. The IVC documents are set to expire on October 22, 2023.

## 2023-10-17 NOTE — ED Notes (Signed)
 Patient informed of being transferred to [facility] hill and patient is worried she will lose her housing and they will get rid of all her belongings while she is in [facility].  Reports her landlord is not renewing her lease and has to be out by July 31st.

## 2023-10-17 NOTE — ED Provider Notes (Signed)
 Emergency Medicine Observation Re-evaluation Note  Annette Mathews is a 78 y.o. female, seen on rounds today.  Pt initially presented to the ED for complaints of Hallucinations Currently, the patient is eating breakfast calmly and interactive with staff.  Physical Exam  BP 131/67 (BP Location: Left Arm)   Pulse (!) 56   Temp 98.1 F (36.7 C) (Oral)   Resp 18   Ht 4' 9 (1.448 m)   Wt 63 kg   SpO2 94%   BMI 30.08 kg/m  Physical Exam General: no distress Cardiac: normal perfusion Lungs: no retractions Psych: cooperative    Plan  Current plan is for inpatient admission to [facility].    Yamilett Anastos, DO 10/17/23 [phone]

## 2023-10-17 NOTE — ED Notes (Signed)
 Called the [facility], to arrange transport to [facility], No ETA ws provided but they said that the officer would reach out to number provided to establish eta and communications.Pt is on the list.

## 2023-10-17 NOTE — ED Notes (Signed)
 The [facility] was contacted to arrange transportation to [facility].  There was no ETA provided. RN's telephone number provided for any questions.

## 2023-10-28 DIAGNOSIS — Z881 Allergy status to other antibiotic agents status: Secondary | ICD-10-CM | POA: Diagnosis not present

## 2023-10-28 DIAGNOSIS — M542 Cervicalgia: Secondary | ICD-10-CM | POA: Diagnosis not present

## 2023-10-28 DIAGNOSIS — S46912A Strain of unspecified muscle, fascia and tendon at shoulder and upper arm level, left arm, initial encounter: Secondary | ICD-10-CM | POA: Diagnosis not present

## 2023-10-28 DIAGNOSIS — S161XXA Strain of muscle, fascia and tendon at neck level, initial encounter: Secondary | ICD-10-CM | POA: Diagnosis not present

## 2023-10-28 DIAGNOSIS — R443 Hallucinations, unspecified: Secondary | ICD-10-CM | POA: Diagnosis not present

## 2023-10-28 DIAGNOSIS — Z885 Allergy status to narcotic agent status: Secondary | ICD-10-CM | POA: Diagnosis not present

## 2023-10-28 DIAGNOSIS — R519 Headache, unspecified: Secondary | ICD-10-CM | POA: Diagnosis not present

## 2023-10-28 DIAGNOSIS — M25512 Pain in left shoulder: Secondary | ICD-10-CM | POA: Diagnosis not present

## 2023-10-28 DIAGNOSIS — Z91041 Radiographic dye allergy status: Secondary | ICD-10-CM | POA: Diagnosis not present

## 2023-10-28 DIAGNOSIS — S0990XA Unspecified injury of head, initial encounter: Secondary | ICD-10-CM | POA: Diagnosis not present

## 2023-11-04 DIAGNOSIS — J3081 Allergic rhinitis due to animal (cat) (dog) hair and dander: Secondary | ICD-10-CM | POA: Diagnosis not present

## 2023-11-04 DIAGNOSIS — J3089 Other allergic rhinitis: Secondary | ICD-10-CM | POA: Diagnosis not present

## 2023-11-04 DIAGNOSIS — J301 Allergic rhinitis due to pollen: Secondary | ICD-10-CM | POA: Diagnosis not present

## 2023-11-11 DIAGNOSIS — J301 Allergic rhinitis due to pollen: Secondary | ICD-10-CM | POA: Diagnosis not present

## 2023-11-11 DIAGNOSIS — J3081 Allergic rhinitis due to animal (cat) (dog) hair and dander: Secondary | ICD-10-CM | POA: Diagnosis not present

## 2023-11-11 DIAGNOSIS — J3089 Other allergic rhinitis: Secondary | ICD-10-CM | POA: Diagnosis not present

## 2023-11-13 DIAGNOSIS — Z6830 Body mass index (BMI) 30.0-30.9, adult: Secondary | ICD-10-CM | POA: Diagnosis not present

## 2023-11-13 DIAGNOSIS — I10 Essential (primary) hypertension: Secondary | ICD-10-CM | POA: Diagnosis not present

## 2023-11-13 DIAGNOSIS — F22 Delusional disorders: Secondary | ICD-10-CM | POA: Diagnosis not present

## 2023-11-18 DIAGNOSIS — J301 Allergic rhinitis due to pollen: Secondary | ICD-10-CM | POA: Diagnosis not present

## 2023-11-18 DIAGNOSIS — J3081 Allergic rhinitis due to animal (cat) (dog) hair and dander: Secondary | ICD-10-CM | POA: Diagnosis not present

## 2023-11-18 DIAGNOSIS — J3089 Other allergic rhinitis: Secondary | ICD-10-CM | POA: Diagnosis not present

## 2023-12-02 DIAGNOSIS — J3081 Allergic rhinitis due to animal (cat) (dog) hair and dander: Secondary | ICD-10-CM | POA: Diagnosis not present

## 2023-12-02 DIAGNOSIS — J3089 Other allergic rhinitis: Secondary | ICD-10-CM | POA: Diagnosis not present

## 2023-12-02 DIAGNOSIS — J301 Allergic rhinitis due to pollen: Secondary | ICD-10-CM | POA: Diagnosis not present

## 2023-12-10 DIAGNOSIS — J301 Allergic rhinitis due to pollen: Secondary | ICD-10-CM | POA: Diagnosis not present

## 2023-12-10 DIAGNOSIS — J3089 Other allergic rhinitis: Secondary | ICD-10-CM | POA: Diagnosis not present

## 2023-12-10 DIAGNOSIS — J3081 Allergic rhinitis due to animal (cat) (dog) hair and dander: Secondary | ICD-10-CM | POA: Diagnosis not present

## 2023-12-23 DIAGNOSIS — J3089 Other allergic rhinitis: Secondary | ICD-10-CM | POA: Diagnosis not present

## 2023-12-23 DIAGNOSIS — J301 Allergic rhinitis due to pollen: Secondary | ICD-10-CM | POA: Diagnosis not present

## 2023-12-23 DIAGNOSIS — J3081 Allergic rhinitis due to animal (cat) (dog) hair and dander: Secondary | ICD-10-CM | POA: Diagnosis not present

## 2023-12-26 DIAGNOSIS — J301 Allergic rhinitis due to pollen: Secondary | ICD-10-CM | POA: Diagnosis not present

## 2023-12-26 DIAGNOSIS — J3081 Allergic rhinitis due to animal (cat) (dog) hair and dander: Secondary | ICD-10-CM | POA: Diagnosis not present

## 2023-12-26 DIAGNOSIS — J3089 Other allergic rhinitis: Secondary | ICD-10-CM | POA: Diagnosis not present

## 2023-12-30 DIAGNOSIS — J0110 Acute frontal sinusitis, unspecified: Secondary | ICD-10-CM | POA: Diagnosis not present

## 2023-12-30 DIAGNOSIS — R051 Acute cough: Secondary | ICD-10-CM | POA: Diagnosis not present

## 2024-01-13 DIAGNOSIS — J3081 Allergic rhinitis due to animal (cat) (dog) hair and dander: Secondary | ICD-10-CM | POA: Diagnosis not present

## 2024-01-13 DIAGNOSIS — J3089 Other allergic rhinitis: Secondary | ICD-10-CM | POA: Diagnosis not present

## 2024-01-13 DIAGNOSIS — J301 Allergic rhinitis due to pollen: Secondary | ICD-10-CM | POA: Diagnosis not present

## 2024-01-20 DIAGNOSIS — J3089 Other allergic rhinitis: Secondary | ICD-10-CM | POA: Diagnosis not present

## 2024-01-20 DIAGNOSIS — J301 Allergic rhinitis due to pollen: Secondary | ICD-10-CM | POA: Diagnosis not present

## 2024-01-20 DIAGNOSIS — J3081 Allergic rhinitis due to animal (cat) (dog) hair and dander: Secondary | ICD-10-CM | POA: Diagnosis not present

## 2024-01-27 DIAGNOSIS — H401131 Primary open-angle glaucoma, bilateral, mild stage: Secondary | ICD-10-CM | POA: Diagnosis not present

## 2024-01-27 DIAGNOSIS — J3081 Allergic rhinitis due to animal (cat) (dog) hair and dander: Secondary | ICD-10-CM | POA: Diagnosis not present

## 2024-01-27 DIAGNOSIS — J3089 Other allergic rhinitis: Secondary | ICD-10-CM | POA: Diagnosis not present

## 2024-01-27 DIAGNOSIS — J301 Allergic rhinitis due to pollen: Secondary | ICD-10-CM | POA: Diagnosis not present

## 2024-02-03 DIAGNOSIS — J3081 Allergic rhinitis due to animal (cat) (dog) hair and dander: Secondary | ICD-10-CM | POA: Diagnosis not present

## 2024-02-03 DIAGNOSIS — J301 Allergic rhinitis due to pollen: Secondary | ICD-10-CM | POA: Diagnosis not present

## 2024-02-03 DIAGNOSIS — J3089 Other allergic rhinitis: Secondary | ICD-10-CM | POA: Diagnosis not present

## 2024-02-06 DIAGNOSIS — Z23 Encounter for immunization: Secondary | ICD-10-CM | POA: Diagnosis not present

## 2024-02-10 DIAGNOSIS — J301 Allergic rhinitis due to pollen: Secondary | ICD-10-CM | POA: Diagnosis not present

## 2024-02-10 DIAGNOSIS — J3081 Allergic rhinitis due to animal (cat) (dog) hair and dander: Secondary | ICD-10-CM | POA: Diagnosis not present

## 2024-02-10 DIAGNOSIS — J3089 Other allergic rhinitis: Secondary | ICD-10-CM | POA: Diagnosis not present

## 2024-02-14 DIAGNOSIS — R197 Diarrhea, unspecified: Secondary | ICD-10-CM | POA: Diagnosis not present

## 2024-02-14 DIAGNOSIS — R2689 Other abnormalities of gait and mobility: Secondary | ICD-10-CM | POA: Diagnosis not present

## 2024-02-14 DIAGNOSIS — F22 Delusional disorders: Secondary | ICD-10-CM | POA: Diagnosis not present

## 2024-02-17 DIAGNOSIS — J3089 Other allergic rhinitis: Secondary | ICD-10-CM | POA: Diagnosis not present

## 2024-02-17 DIAGNOSIS — J301 Allergic rhinitis due to pollen: Secondary | ICD-10-CM | POA: Diagnosis not present

## 2024-02-17 DIAGNOSIS — J3081 Allergic rhinitis due to animal (cat) (dog) hair and dander: Secondary | ICD-10-CM | POA: Diagnosis not present

## 2024-02-20 DIAGNOSIS — Z23 Encounter for immunization: Secondary | ICD-10-CM | POA: Diagnosis not present

## 2024-02-25 DIAGNOSIS — J3089 Other allergic rhinitis: Secondary | ICD-10-CM | POA: Diagnosis not present

## 2024-02-25 DIAGNOSIS — J301 Allergic rhinitis due to pollen: Secondary | ICD-10-CM | POA: Diagnosis not present

## 2024-02-25 DIAGNOSIS — J3081 Allergic rhinitis due to animal (cat) (dog) hair and dander: Secondary | ICD-10-CM | POA: Diagnosis not present

## 2024-03-03 DIAGNOSIS — J3081 Allergic rhinitis due to animal (cat) (dog) hair and dander: Secondary | ICD-10-CM | POA: Diagnosis not present

## 2024-03-03 DIAGNOSIS — J301 Allergic rhinitis due to pollen: Secondary | ICD-10-CM | POA: Diagnosis not present

## 2024-03-03 DIAGNOSIS — J3089 Other allergic rhinitis: Secondary | ICD-10-CM | POA: Diagnosis not present

## 2024-03-10 DIAGNOSIS — J3081 Allergic rhinitis due to animal (cat) (dog) hair and dander: Secondary | ICD-10-CM | POA: Diagnosis not present

## 2024-03-10 DIAGNOSIS — J301 Allergic rhinitis due to pollen: Secondary | ICD-10-CM | POA: Diagnosis not present

## 2024-03-10 DIAGNOSIS — J3089 Other allergic rhinitis: Secondary | ICD-10-CM | POA: Diagnosis not present

## 2024-03-17 DIAGNOSIS — J301 Allergic rhinitis due to pollen: Secondary | ICD-10-CM | POA: Diagnosis not present

## 2024-03-17 DIAGNOSIS — J3089 Other allergic rhinitis: Secondary | ICD-10-CM | POA: Diagnosis not present

## 2024-03-17 DIAGNOSIS — J3081 Allergic rhinitis due to animal (cat) (dog) hair and dander: Secondary | ICD-10-CM | POA: Diagnosis not present
